# Patient Record
Sex: Female | Born: 1960 | Race: White | Hispanic: No | Marital: Married | Smoking: Never smoker
Health system: Southern US, Community
[De-identification: ages and names within clinical notes are randomized; demographics above are authoritative.]

## PROBLEM LIST (undated history)

## (undated) DIAGNOSIS — K449 Diaphragmatic hernia without obstruction or gangrene: Secondary | ICD-10-CM

## (undated) DIAGNOSIS — J302 Other seasonal allergic rhinitis: Secondary | ICD-10-CM

## (undated) DIAGNOSIS — I10 Essential (primary) hypertension: Secondary | ICD-10-CM

## (undated) DIAGNOSIS — E039 Hypothyroidism, unspecified: Secondary | ICD-10-CM

## (undated) DIAGNOSIS — E079 Disorder of thyroid, unspecified: Secondary | ICD-10-CM

## (undated) DIAGNOSIS — Z9889 Other specified postprocedural states: Secondary | ICD-10-CM

## (undated) DIAGNOSIS — K219 Gastro-esophageal reflux disease without esophagitis: Secondary | ICD-10-CM

## (undated) DIAGNOSIS — F419 Anxiety disorder, unspecified: Secondary | ICD-10-CM

## (undated) DIAGNOSIS — F32A Depression, unspecified: Secondary | ICD-10-CM

## (undated) DIAGNOSIS — R011 Cardiac murmur, unspecified: Secondary | ICD-10-CM

## (undated) HISTORY — DX: Other seasonal allergic rhinitis: J30.2

## (undated) HISTORY — DX: Hypothyroidism, unspecified: E03.9

## (undated) HISTORY — PX: ABDOMINAL HYSTERECTOMY: SHX81

## (undated) HISTORY — DX: Cardiac murmur, unspecified: R01.1

## (undated) HISTORY — PX: NASAL SEPTUM SURGERY: SHX37

## (undated) HISTORY — DX: Depression, unspecified: F32.A

## (undated) HISTORY — DX: Anxiety disorder, unspecified: F41.9

## (undated) HISTORY — DX: Other specified postprocedural states: Z98.890

## (undated) HISTORY — DX: Diaphragmatic hernia without obstruction or gangrene: K44.9

## (undated) HISTORY — DX: Gastro-esophageal reflux disease without esophagitis: K21.9

## (undated) HISTORY — PX: APPENDECTOMY: SHX54

---

## 1988-03-29 HISTORY — PX: DILATION AND CURETTAGE OF UTERUS: SHX78

## 1992-03-29 HISTORY — PX: WISDOM TOOTH EXTRACTION: SHX21

## 2006-06-06 ENCOUNTER — Emergency Department (HOSPITAL_COMMUNITY): Admission: EM | Admit: 2006-06-06 | Discharge: 2006-06-06 | Payer: Self-pay | Admitting: *Deleted
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
## 2019-05-28 DIAGNOSIS — E039 Hypothyroidism, unspecified: Secondary | ICD-10-CM | POA: Diagnosis not present

## 2019-05-28 DIAGNOSIS — F419 Anxiety disorder, unspecified: Secondary | ICD-10-CM | POA: Diagnosis not present

## 2019-05-28 DIAGNOSIS — I10 Essential (primary) hypertension: Secondary | ICD-10-CM | POA: Diagnosis not present

## 2019-05-28 DIAGNOSIS — K219 Gastro-esophageal reflux disease without esophagitis: Secondary | ICD-10-CM | POA: Diagnosis not present

## 2019-06-11 DIAGNOSIS — E785 Hyperlipidemia, unspecified: Secondary | ICD-10-CM | POA: Diagnosis not present

## 2019-06-11 DIAGNOSIS — I10 Essential (primary) hypertension: Secondary | ICD-10-CM | POA: Diagnosis not present

## 2019-06-11 DIAGNOSIS — E039 Hypothyroidism, unspecified: Secondary | ICD-10-CM | POA: Diagnosis not present

## 2019-06-11 DIAGNOSIS — K219 Gastro-esophageal reflux disease without esophagitis: Secondary | ICD-10-CM | POA: Diagnosis not present

## 2019-06-19 DIAGNOSIS — Z1231 Encounter for screening mammogram for malignant neoplasm of breast: Secondary | ICD-10-CM | POA: Diagnosis not present

## 2019-06-19 DIAGNOSIS — Z803 Family history of malignant neoplasm of breast: Secondary | ICD-10-CM | POA: Diagnosis not present

## 2019-07-03 DIAGNOSIS — R922 Inconclusive mammogram: Secondary | ICD-10-CM | POA: Diagnosis not present

## 2019-07-03 DIAGNOSIS — R928 Other abnormal and inconclusive findings on diagnostic imaging of breast: Secondary | ICD-10-CM | POA: Diagnosis not present

## 2019-07-03 DIAGNOSIS — N6002 Solitary cyst of left breast: Secondary | ICD-10-CM | POA: Diagnosis not present

## 2019-10-18 DIAGNOSIS — I10 Essential (primary) hypertension: Secondary | ICD-10-CM | POA: Diagnosis not present

## 2019-10-18 DIAGNOSIS — K219 Gastro-esophageal reflux disease without esophagitis: Secondary | ICD-10-CM | POA: Diagnosis not present

## 2019-10-18 DIAGNOSIS — E785 Hyperlipidemia, unspecified: Secondary | ICD-10-CM | POA: Diagnosis not present

## 2019-10-18 DIAGNOSIS — E039 Hypothyroidism, unspecified: Secondary | ICD-10-CM | POA: Diagnosis not present

## 2019-10-26 ENCOUNTER — Encounter: Payer: Self-pay | Admitting: Emergency Medicine

## 2019-10-26 ENCOUNTER — Emergency Department
Admission: EM | Admit: 2019-10-26 | Discharge: 2019-10-26 | Disposition: A | Discharge status: Home / Self Care | Payer: BC Managed Care – PPO | Source: Home / Self Care

## 2019-10-26 ENCOUNTER — Other Ambulatory Visit: Payer: Self-pay

## 2019-10-26 DIAGNOSIS — J029 Acute pharyngitis, unspecified: Secondary | ICD-10-CM | POA: Diagnosis not present

## 2019-10-26 HISTORY — DX: Disorder of thyroid, unspecified: E07.9

## 2019-10-26 HISTORY — DX: Essential (primary) hypertension: I10

## 2019-10-26 LAB — POCT RAPID STREP A (OFFICE): Rapid Strep A Screen: NEGATIVE

## 2019-10-26 NOTE — Discharge Instructions (Addendum)
Return if any problems.

## 2019-10-26 NOTE — ED Triage Notes (Signed)
Sore throat x 2-3 days R ear pain started last night  Tylenol at 0500 (1gm) Exposure to strep - sister runs a daycare No COVID vaccine Denies any fever

## 2019-10-26 NOTE — ED Provider Notes (Signed)
[facility]    CSN: [id number] Arrival date & time: 10/26/19  1856      History   Chief Complaint Chief Complaint  Patient presents with  . Sore Throat  . Otalgia    right    HPI Kelli Cooke is a 59 y.o. female.   The history is provided by the patient.  Sore Throat This is a new problem. The current episode started more than 2 days ago. The problem occurs constantly. The problem has been gradually worsening. Nothing aggravates the symptoms. Nothing relieves the symptoms. She has tried nothing for the symptoms. The treatment provided moderate relief.  Otalgia  Pt denies possible covid exposure. Pt is concerned that she has strep. Pt reports no covid risk.  Past Medical History:  Diagnosis Date  . Hypertension   . Thyroid disease     There are no problems to display for this patient.   Past Surgical History:  Procedure Laterality Date  . ABDOMINAL HYSTERECTOMY    . APPENDECTOMY      OB History   No obstetric history on file.      Home Medications    Prior to Admission medications   Medication Sig Start Date End Date Taking? Authorizing Provider  busPIRone (BUSPAR) 15 MG tablet Take 15 mg by mouth daily. 08/17/19  Yes [provider]  levothyroxine (SYNTHROID) 88 MCG tablet Take 88 mcg by mouth daily. 10/04/19  Yes [provider]  lisinopril (ZESTRIL) 20 MG tablet Take 20 mg by mouth daily. 10/22/19  Yes [provider]  omeprazole (PRILOSEC) 20 MG capsule Take 20 mg by mouth daily. 08/28/19  Yes [provider]    Family History Family History  Problem Relation Age of Onset  . AAA (abdominal aortic aneurysm) Mother   . Healthy Sister   . Healthy Brother   . Hypertension Sister   . Hypertension Sister   . Thyroid disease Sister     Social History Social History   Tobacco Use  . Smoking status: Never Smoker  . Smokeless tobacco: Never Used  Vaping Use  . Vaping Use: Never used  Substance Use  Topics  . Alcohol use: Yes    Alcohol/week: 1.0 standard drink    Types: 1 Glasses of wine per week  . Drug use: Never     Allergies   Patient has no known allergies.   Review of Systems Review of Systems  HENT: Positive for ear pain.   All other systems reviewed and are negative.    Physical Exam Triage Vital Signs ED Triage Vitals  Enc Vitals Group     BP 10/26/19 0954 (!) 135/90     Pulse Rate 10/26/19 0954 73     Resp 10/26/19 0954 15     Temp 10/26/19 0954 98.7 F (37.1 C)     Temp Source 10/26/19 0954 Oral     SpO2 10/26/19 0954 97 %     Weight 10/26/19 0957 157 lb (71.2 kg)     Height 10/26/19 0957 5' 3.5" (1.613 m)     Head Circumference --      Peak Flow --      Pain Score 10/26/19 0956 4     Pain Loc --      Pain Edu? --      Excl. in [place]? --    No data found.  Updated Vital Signs BP (!) 135/90 (BP Location: Right Arm)   Pulse 73   Temp 98.7 F (  37.1 C) (Oral)   Resp 15   Ht 5' 3.5" (1.613 m)   Wt 71.2 kg   SpO2 97%   BMI 27.38 kg/m   Visual Acuity Right Eye Distance:   Left Eye Distance:   Bilateral Distance:    Right Eye Near:   Left Eye Near:    Bilateral Near:     Physical Exam Vitals and nursing note reviewed.  Constitutional:      Appearance: She is well-developed.  HENT:     Head: Normocephalic.     Mouth/Throat:     Pharynx: Posterior oropharyngeal erythema present.  Eyes:     Conjunctiva/sclera: Conjunctivae normal.  Cardiovascular:     Rate and Rhythm: Normal rate.  Pulmonary:     Effort: Pulmonary effort is normal.  Abdominal:     General: There is no distension.  Musculoskeletal:        General: Normal range of motion.     Cervical back: Normal range of motion.  Skin:    General: Skin is warm.  Neurological:     General: No focal deficit present.     Mental Status: She is alert and oriented to person, place, and time.  Psychiatric:        Mood and Affect: Mood normal.      UC Treatments / Results   Labs (all labs ordered are listed, but only abnormal results are displayed) Labs Reviewed - No data to display  EKG   Radiology No results found.  Procedures Procedures (including critical care time)  Medications Ordered in UC Medications - No data to display  Initial Impression / Assessment and Plan / UC Course  I have reviewed the triage vital signs and the nursing notes.  Pertinent labs & imaging results that were available during my care of the patient were reviewed by me and considered in my medical decision making (see chart for details).     MDM: Strep is negative.  Pt declines covid testing.  Final Clinical Impressions(s) / UC Diagnoses   Final diagnoses:  Acute pharyngitis, unspecified etiology   Discharge Instructions   None    ED Prescriptions    None     PDMP not reviewed this encounter.  An After Visit Summary was printed and given to the patient.    Fransico Meadow, [place] 10/26/19 1027

## 2019-10-29 LAB — STREP A DNA PROBE: Group A Strep Probe: NOT DETECTED

## 2020-03-25 ENCOUNTER — Telehealth: Payer: Self-pay

## 2020-03-25 ENCOUNTER — Other Ambulatory Visit: Payer: Self-pay

## 2020-03-25 ENCOUNTER — Emergency Department
Admission: EM | Admit: 2020-03-25 | Discharge: 2020-03-25 | Disposition: A | Discharge status: Home / Self Care | Payer: BC Managed Care – PPO | Source: Home / Self Care | Attending: Internal Medicine | Admitting: Internal Medicine

## 2020-03-25 DIAGNOSIS — J3089 Other allergic rhinitis: Secondary | ICD-10-CM

## 2020-03-25 MED ORDER — GUAIFENESIN ER 600 MG PO TB12: 600.0000 mg | ORAL_TABLET | Freq: Two times a day (BID) | ORAL | 0 refills | Status: AC

## 2020-03-25 MED ORDER — GUAIFENESIN ER 600 MG PO TB12: 600.0000 mg | ORAL_TABLET | Freq: Two times a day (BID) | ORAL | 0 refills | Status: DC

## 2020-03-25 MED ORDER — BENZONATATE 100 MG PO CAPS: 100.0000 mg | ORAL_CAPSULE | Freq: Three times a day (TID) | ORAL | 0 refills | Status: DC

## 2020-03-25 NOTE — ED Triage Notes (Signed)
Patient presents to Urgent Care with complaints of runny nose and nasal congestion since about a week ago. Patient reports it feels like the congestion has spread to her left ear as well. Has been taking otc cold and flu meds w/ minimal improvement. Pt has not been vaccinated for covid or flu.

## 2020-03-25 NOTE — ED Provider Notes (Signed)
[facility]    CSN: [id number] Arrival date & time: 03/25/20  0931      History   Chief Complaint Chief Complaint  Patient presents with   Nasal Congestion    HPI Kelli Cooke is a 59 y.o. female with a history of seasonal allergies on Claritin and Flonase comes to urgent care with complaints of nasal congestion over the past 3 to 4 days.  Patient says symptoms started insidiously and is been progressively worse.  She currently has pressure and some discomfort in the left ear.  No shortness of breath, wheezing or sputum production.  She has fullness in the left ear and itchy throat as well as eyes.  No sick contacts.  No generalized body aches.  No fever or chills.  Nasal discharge is clear with some yellowish specks.  She has had small amount of nasal bleeding from the right ear. HPI  Past Medical History:  Diagnosis Date   Hypertension    Thyroid disease     There are no problems to display for this patient.   Past Surgical History:  Procedure Laterality Date   ABDOMINAL HYSTERECTOMY     APPENDECTOMY      OB History   No obstetric history on file.      Home Medications    Prior to Admission medications   Medication Sig Start Date End Date Taking? Authorizing Provider  benzonatate (TESSALON) 100 MG capsule Take 1 capsule (100 mg total) by mouth every 8 (eight) hours. 03/25/20  Yes Truong Delcastillo, Myrene Galas, MD  guaiFENesin (MUCINEX) 600 MG 12 hr tablet Take 1 tablet (600 mg total) by mouth 2 (two) times daily for 14 days. 03/25/20 04/08/20 Yes Hilary Milks, Myrene Galas, MD  busPIRone (BUSPAR) 15 MG tablet Take 15 mg by mouth daily. 08/17/19   [provider]  levothyroxine (SYNTHROID) 88 MCG tablet Take 88 mcg by mouth daily. 10/04/19   [provider]  lisinopril (ZESTRIL) 20 MG tablet Take 20 mg by mouth daily. 10/22/19   [provider]  omeprazole (PRILOSEC) 20 MG capsule Take 20 mg by mouth daily. 08/28/19   [provider]     Family History Family History  Problem Relation Age of Onset   AAA (abdominal aortic aneurysm) Mother    Healthy Sister    Healthy Brother    Hypertension Sister    Hypertension Sister    Thyroid disease Sister     Social History Social History   Tobacco Use   Smoking status: Never Smoker   Smokeless tobacco: Never Used  Scientific laboratory technician Use: Never used  Substance Use Topics   Alcohol use: Yes    Alcohol/week: 1.0 standard drink    Types: 1 Glasses of wine per week   Drug use: Never     Allergies   Sulfa antibiotics   Review of Systems Review of Systems  Constitutional: Negative for activity change, chills, fatigue and fever.  HENT: Positive for congestion, ear pain, rhinorrhea and sore throat.   Respiratory: Positive for cough. Negative for shortness of breath and wheezing.   Gastrointestinal: Negative.   Musculoskeletal: Positive for myalgias. Negative for arthralgias.  Neurological: Negative for headaches.     Physical Exam Triage Vital Signs ED Triage Vitals  Enc Vitals Group     BP 03/25/20 0950 (!) 135/93     Pulse Rate 03/25/20 0950 76     Resp 03/25/20 0950 16     Temp 03/25/20 0950 98.3 F (  36.8 C)     Temp Source 03/25/20 0950 Oral     SpO2 03/25/20 0950 99 %     Weight --      Height --      Head Circumference --      Peak Flow --      Pain Score 03/25/20 0949 5     Pain Loc --      Pain Edu? --      Excl. in [place]? --    No data found.  Updated Vital Signs BP (!) 135/93 (BP Location: Right Arm)    Pulse 76    Temp 98.3 F (36.8 C) (Oral)    Resp 16    SpO2 99%   Visual Acuity Right Eye Distance:   Left Eye Distance:   Bilateral Distance:    Right Eye Near:   Left Eye Near:    Bilateral Near:     Physical Exam Vitals and nursing note reviewed.  Constitutional:      General: She is not in acute distress.    Appearance: She is ill-appearing.  HENT:     Mouth/Throat:     Mouth: Mucous membranes are moist.      Pharynx: No oropharyngeal exudate or posterior oropharyngeal erythema.  Cardiovascular:     Rate and Rhythm: Normal rate and regular rhythm.     Pulses: Normal pulses.     Heart sounds: Normal heart sounds.  Pulmonary:     Effort: Pulmonary effort is normal.     Breath sounds: Normal breath sounds.  Neurological:     General: No focal deficit present.     Mental Status: She is alert and oriented to person, place, and time.      UC Treatments / Results  Labs (all labs ordered are listed, but only abnormal results are displayed) Labs Reviewed - No data to display  EKG   Radiology No results found.  Procedures Procedures (including critical care time)  Medications Ordered in UC Medications - No data to display  Initial Impression / Assessment and Plan / UC Course  I have reviewed the triage vital signs and the nursing notes.  Pertinent labs & imaging results that were available during my care of the patient were reviewed by me and considered in my medical decision making (see chart for details).     1.  Allergic rhinosinisitis: Patient is advised to continue Flonase and Zyrtec We will add Mucinex to the treatment regimen Saline nasal spray Humidifier use will be helpful No indication for antibiotics at this time Final Clinical Impressions(s) / UC Diagnoses   Final diagnoses:  Allergic rhinitis due to other allergic trigger, unspecified seasonality     Discharge Instructions     Please use saline nasal spray We will add Mucinex to your treatment regimen No indication for antibiotics at this time If your symptoms worsen please return to the urgent care to be reevaluated.   ED Prescriptions    Medication Sig Dispense Auth. Provider   guaiFENesin (MUCINEX) 600 MG 12 hr tablet Take 1 tablet (600 mg total) by mouth 2 (two) times daily for 14 days. 28 tablet Farouk Vivero, Britta Mccreedy, MD   benzonatate (TESSALON) 100 MG capsule Take 1 capsule (100 mg total) by mouth every  8 (eight) hours. 21 capsule Jlyn Bracamonte, Britta Mccreedy, MD     PDMP not reviewed this encounter.   Merrilee Jansky, MD 03/25/20 1055

## 2020-03-25 NOTE — Telephone Encounter (Signed)
Medications changed to alternate pharmacy location per pt request.

## 2020-03-25 NOTE — Discharge Instructions (Signed)
Please use saline nasal spray We will add Mucinex to your treatment regimen No indication for antibiotics at this time If your symptoms worsen please return to the urgent care to be reevaluated.

## 2020-05-09 DIAGNOSIS — J302 Other seasonal allergic rhinitis: Secondary | ICD-10-CM | POA: Diagnosis not present

## 2020-05-09 DIAGNOSIS — F411 Generalized anxiety disorder: Secondary | ICD-10-CM | POA: Diagnosis not present

## 2020-05-09 DIAGNOSIS — I10 Essential (primary) hypertension: Secondary | ICD-10-CM | POA: Diagnosis not present

## 2020-05-09 DIAGNOSIS — E039 Hypothyroidism, unspecified: Secondary | ICD-10-CM | POA: Diagnosis not present

## 2020-05-13 DIAGNOSIS — J302 Other seasonal allergic rhinitis: Secondary | ICD-10-CM | POA: Diagnosis not present

## 2020-05-19 ENCOUNTER — Emergency Department (INDEPENDENT_AMBULATORY_CARE_PROVIDER_SITE_OTHER): Payer: BC Managed Care – PPO

## 2020-05-19 ENCOUNTER — Emergency Department
Admission: EM | Admit: 2020-05-19 | Discharge: 2020-05-19 | Disposition: A | Discharge status: Home / Self Care | Payer: BC Managed Care – PPO | Source: Home / Self Care | Attending: Family Medicine | Admitting: Family Medicine

## 2020-05-19 ENCOUNTER — Other Ambulatory Visit: Payer: Self-pay

## 2020-05-19 DIAGNOSIS — M25572 Pain in left ankle and joints of left foot: Secondary | ICD-10-CM | POA: Diagnosis not present

## 2020-05-19 DIAGNOSIS — W109XXA Fall (on) (from) unspecified stairs and steps, initial encounter: Secondary | ICD-10-CM | POA: Diagnosis not present

## 2020-05-19 DIAGNOSIS — S93492A Sprain of other ligament of left ankle, initial encounter: Secondary | ICD-10-CM

## 2020-05-19 DIAGNOSIS — S99912A Unspecified injury of left ankle, initial encounter: Secondary | ICD-10-CM | POA: Diagnosis not present

## 2020-05-19 NOTE — ED Triage Notes (Signed)
Pt presents to Urgent Care with c/o L ankle pain following injury today. She reports tripping while going down the stairs and believes she twisted her L ankle. She is unable to bear weight. Lateral L ankle is noted to be swollen and bruised. Pt iced it and took ibuprofen before coming to UC.

## 2020-05-19 NOTE — Discharge Instructions (Addendum)
Ice and elevate to reduce the swelling and pain Continue ibuprofen 800 up to 3 times a day See sports medicine or orthopedics if you fail to improve

## 2020-05-19 NOTE — ED Provider Notes (Signed)
[name]    CSN: [id number] Arrival date & time: 05/19/20  1300      History   Chief Complaint Chief Complaint  Patient presents with  . Ankle Pain    Injury to L ankle    HPI Kelli Cooke is a 60 y.o. female.   HPI   Patient tripped going downstairs today and injured her left ankle.  She states that her foot inverted, and she fell.  She felt a pop.  She had to sit for a few minutes before she was able to stand because of the pain.  She is here for evaluation.  Has not had prior problems or fractures with this ankle.  She is in good health with well-managed hypothyroidism and hypertension. She put some ice on the area.  She took some ibuprofen.  These things have helped.  Past Medical History:  Diagnosis Date  . Hypertension   . Thyroid disease     There are no problems to display for this patient.   Past Surgical History:  Procedure Laterality Date  . ABDOMINAL HYSTERECTOMY    . APPENDECTOMY      OB History   No obstetric history on file.      Home Medications    Prior to Admission medications   Medication Sig Start Date End Date Taking? Authorizing Provider  azithromycin (ZITHROMAX) 500 MG tablet Take 500 mg by mouth daily. 05/16/20 05/20/20 Yes [provider]  busPIRone (BUSPAR) 15 MG tablet Take 15 mg by mouth daily. 08/17/19   [provider]  levothyroxine (SYNTHROID) 88 MCG tablet Take 88 mcg by mouth daily. 10/04/19   [provider]  lisinopril (ZESTRIL) 20 MG tablet Take 20 mg by mouth daily. 10/22/19   [provider]  omeprazole (PRILOSEC) 20 MG capsule Take 20 mg by mouth daily. 08/28/19   [provider]    Family History Family History  Problem Relation Age of Onset  . AAA (abdominal aortic aneurysm) Mother   . Diabetes Mother   . Heart disease Father   . Healthy Sister   . Healthy Brother   . Hypertension Sister   . Hypertension Sister   . Thyroid disease Sister     Social  History Social History   Tobacco Use  . Smoking status: Never Smoker  . Smokeless tobacco: Never Used  Vaping Use  . Vaping Use: Never used  Substance Use Topics  . Alcohol use: Not Currently    Alcohol/week: 1.0 standard drink    Types: 1 Glasses of wine per week  . Drug use: Never     Allergies   Sulfa antibiotics   Review of Systems Review of Systems See HPI  Physical Exam Triage Vital Signs ED Triage Vitals  Enc Vitals Group     BP 05/19/20 1335 (!) 145/86     Pulse Rate 05/19/20 1335 83     Resp 05/19/20 1335 20     Temp 05/19/20 1335 99 F (37.2 C)     Temp Source 05/19/20 1335 Oral     SpO2 05/19/20 1335 97 %     Weight 05/19/20 1328 150 lb (68 kg)     Height 05/19/20 1328 5' 3.5" (1.613 m)     Head Circumference --      Peak Flow --      Pain Score 05/19/20 1328 6     Pain Loc --      Pain Edu? --  Excl. in [place]? --    No data found.  Updated Vital Signs BP (!) 145/86 (BP Location: Left Arm)   Pulse 83   Temp 99 F (37.2 C) (Oral)   Resp 20   Ht 5' 3.5" (1.613 m)   Wt 68 kg   SpO2 97%   BMI 26.15 kg/m      Physical Exam Constitutional:      General: She is not in acute distress.    Appearance: She is well-developed and well-nourished.     Comments: Mask is in place  HENT:     Head: Normocephalic and atraumatic.     Mouth/Throat:     Mouth: Oropharynx is clear and moist.  Eyes:     Conjunctiva/sclera: Conjunctivae normal.     Pupils: Pupils are equal, round, and reactive to light.  Cardiovascular:     Rate and Rhythm: Normal rate.  Pulmonary:     Effort: Pulmonary effort is normal. No respiratory distress.  Abdominal:     General: There is no distension.     Palpations: Abdomen is soft.  Musculoskeletal:        General: No edema. Normal range of motion.     Cervical back: Normal range of motion.     Comments: Left ankle has tenderness over the distal fibula tip and swelling over the lateral ankle.  There is tenderness in the  lateral foot, approximately the at the ATFL insertion.  Good range of motion.  No instability  Skin:    General: Skin is warm and dry.  Neurological:     Mental Status: She is alert.     Gait: Gait abnormal.  Psychiatric:        Behavior: Behavior normal.      UC Treatments / Results  Labs (all labs ordered are listed, but only abnormal results are displayed) Labs Reviewed - No data to display  EKG   Radiology DG Ankle Complete Left  Result Date: 05/19/2020 CLINICAL DATA:  Ankle injury today EXAM: LEFT ANKLE COMPLETE - 3+ VIEW COMPARISON:  None. FINDINGS: There is no evidence of fracture, dislocation, or joint effusion. There is no evidence of arthropathy or other focal bone abnormality. Soft tissues are unremarkable. IMPRESSION: Negative. Electronically Signed   By: Franchot Gallo M.D.   On: 05/19/2020 14:00    Procedures Procedures (including critical care time)  Medications Ordered in UC Medications - No data to display  Initial Impression / Assessment and Plan / UC Course  I have reviewed the triage vital signs and the nursing notes.  Pertinent labs & imaging results that were available during my care of the patient were reviewed by me and considered in my medical decision making (see chart for details).     Reviewed healing for ankle sprain.  See sports medicine if fails to improve Final Clinical Impressions(s) / UC Diagnoses   Final diagnoses:  Sprain of anterior talofibular ligament of left ankle, initial encounter     Discharge Instructions     Ice and elevate to reduce the swelling and pain Continue ibuprofen 800 up to 3 times a day See sports medicine or orthopedics if you fail to improve    ED Prescriptions    None     PDMP not reviewed this encounter.   Raylene Everts, MD 05/19/20 [phone]

## 2020-05-23 ENCOUNTER — Emergency Department
Admission: EM | Admit: 2020-05-23 | Discharge: 2020-05-23 | Disposition: A | Discharge status: Home / Self Care | Payer: BC Managed Care – PPO | Source: Home / Self Care | Attending: Internal Medicine | Admitting: Internal Medicine

## 2020-05-23 ENCOUNTER — Encounter: Payer: Self-pay | Admitting: Emergency Medicine

## 2020-05-23 ENCOUNTER — Telehealth: Payer: Self-pay | Admitting: Emergency Medicine

## 2020-05-23 ENCOUNTER — Other Ambulatory Visit: Payer: Self-pay

## 2020-05-23 ENCOUNTER — Emergency Department (INDEPENDENT_AMBULATORY_CARE_PROVIDER_SITE_OTHER): Payer: BC Managed Care – PPO

## 2020-05-23 DIAGNOSIS — R059 Cough, unspecified: Secondary | ICD-10-CM | POA: Diagnosis not present

## 2020-05-23 DIAGNOSIS — R058 Other specified cough: Secondary | ICD-10-CM

## 2020-05-23 DIAGNOSIS — J209 Acute bronchitis, unspecified: Secondary | ICD-10-CM | POA: Diagnosis not present

## 2020-05-23 MED ORDER — PREDNISONE 20 MG PO TABS: 20.0000 mg | ORAL_TABLET | Freq: Every day | ORAL | 0 refills | Status: AC

## 2020-05-23 MED ORDER — HYDROCOD POLST-CPM POLST ER 10-8 MG/5ML PO SUER: 5.0000 mL | Freq: Two times a day (BID) | ORAL | 0 refills | Status: DC | PRN

## 2020-05-23 MED ORDER — HYDROCODONE-HOMATROPINE 5-1.5 MG/5ML PO SYRP: 5.0000 mL | ORAL_SOLUTION | Freq: Four times a day (QID) | ORAL | 0 refills | Status: DC | PRN

## 2020-05-23 NOTE — Telephone Encounter (Signed)
Call back from [place] who confirmed pharmacy did have tussionex available

## 2020-05-23 NOTE — ED Triage Notes (Signed)
Pt is coughing nonstop which causes her to vomit and have episodes of incontinence  Recently switched allergy meds and nasal spray Cough is barky sounding No COVID vaccine or COVID  Scooter for left ankle  Denies fever  Finished z-pak last week

## 2020-05-23 NOTE — Discharge Instructions (Signed)
Please take medications as prescribed Oral fluid intake Tylenol as needed for pain If symptoms worsen please return to the urgent care. If you have problems with filling your prescription medications please go to the urgent care.

## 2020-05-23 NOTE — Telephone Encounter (Signed)
Cough medicine is not available at pharmacy (on national back order per pharmacy) pt's husband is picking up prednisone  & will see if tussionex is available - pt will call back - Dr Lanny Cramp updated by RN

## 2020-05-23 NOTE — ED Provider Notes (Signed)
[facility]    CSN: [id number] Arrival date & time: 05/23/20  1038      History   Chief Complaint Chief Complaint  Patient presents with  . Cough    HPI Kelli Cooke is a 60 y.o. female comes to the urgent care with complaints of persistent cough over the past few weeks.  Cough is productive of clear sputum.  Cough has been worsening over the past week.  At the outset, 3 weeks ago, patient was seen and evaluated.  She denies any fever or chills.  No shortness of breath, chest pain or chest pressure.  No wheezing.  Patient has completed a round of steroids 2 weeks ago and azithromycin.  Cough seems to have gotten better and has worsened over the past week.  Patient denies any dizziness.  She has a history of seasonal allergies on over-the-counter remedies.Marland Kitchen   HPI  Past Medical History:  Diagnosis Date  . Hypertension   . Thyroid disease     There are no problems to display for this patient.   Past Surgical History:  Procedure Laterality Date  . ABDOMINAL HYSTERECTOMY    . APPENDECTOMY      OB History   No obstetric history on file.      Home Medications    Prior to Admission medications   Medication Sig Start Date End Date Taking? Authorizing Provider  busPIRone (BUSPAR) 15 MG tablet Take 15 mg by mouth daily. 08/17/19  Yes [provider]  chlorpheniramine-HYDROcodone (TUSSIONEX PENNKINETIC ER) 10-8 MG/5ML SUER Take 5 mLs by mouth every 12 (twelve) hours as needed for cough. 05/23/20  Yes Marzelle Rutten, Myrene Galas, MD  levothyroxine (SYNTHROID) 88 MCG tablet Take 88 mcg by mouth daily. 10/04/19  Yes [provider]  lisinopril (ZESTRIL) 20 MG tablet Take 20 mg by mouth daily. 10/22/19  Yes [provider]  omeprazole (PRILOSEC) 20 MG capsule Take 20 mg by mouth daily. 08/28/19  Yes [provider]  predniSONE (DELTASONE) 20 MG tablet Take 1 tablet (20 mg total) by mouth daily for 5 days. 05/23/20 05/28/20 Yes Kaynen Minner, Myrene Galas,  MD    Family History Family History  Problem Relation Age of Onset  . AAA (abdominal aortic aneurysm) Mother   . Diabetes Mother   . Heart disease Father   . Healthy Sister   . Healthy Brother   . Hypertension Sister   . Hypertension Sister   . Thyroid disease Sister     Social History Social History   Tobacco Use  . Smoking status: Never Smoker  . Smokeless tobacco: Never Used  Vaping Use  . Vaping Use: Never used  Substance Use Topics  . Alcohol use: Not Currently    Alcohol/week: 1.0 standard drink    Types: 1 Glasses of wine per week  . Drug use: Never     Allergies   Sulfa antibiotics   Review of Systems Review of Systems  Constitutional: Negative.   HENT: Negative.   Respiratory: Positive for cough. Negative for choking, chest tightness, shortness of breath and wheezing.   Cardiovascular: Negative.   Neurological: Negative.      Physical Exam Triage Vital Signs ED Triage Vitals  Enc Vitals Group     BP 05/23/20 1058 117/70     Pulse Rate 05/23/20 1058 87     Resp 05/23/20 1058 17     Temp 05/23/20 1058 98.6 F (37 C)     Temp Source 05/23/20 1058 Oral  SpO2 05/23/20 1058 95 %     Weight --      Height --      Head Circumference --      Peak Flow --      Pain Score 05/23/20 1101 3     Pain Loc --      Pain Edu? --      Excl. in [place]? --    No data found.  Updated Vital Signs BP 117/70 (BP Location: Right Arm)   Pulse 87   Temp 98.6 F (37 C) (Oral)   Resp 17   SpO2 95%   Visual Acuity Right Eye Distance:   Left Eye Distance:   Bilateral Distance:    Right Eye Near:   Left Eye Near:    Bilateral Near:     Physical Exam Vitals and nursing note reviewed.  Constitutional:      General: She is not in acute distress.    Appearance: She is not ill-appearing or toxic-appearing.  HENT:     Right Ear: Tympanic membrane normal.     Left Ear: Tympanic membrane normal.  Cardiovascular:     Rate and Rhythm: Normal rate and regular  rhythm.     Pulses: Normal pulses.     Heart sounds: Normal heart sounds.  Pulmonary:     Effort: Pulmonary effort is normal.     Breath sounds: Normal breath sounds. No wheezing, rhonchi or rales.  Abdominal:     General: Bowel sounds are normal.     Palpations: Abdomen is soft.  Neurological:     Mental Status: She is alert.      UC Treatments / Results  Labs (all labs ordered are listed, but only abnormal results are displayed) Labs Reviewed - No data to display  EKG   Radiology DG Chest 2 View  Result Date: 05/23/2020 CLINICAL DATA:  Persistent cough. EXAM: CHEST - 2 VIEW COMPARISON:  None. FINDINGS: The heart size and mediastinal contours are within normal limits. Both lungs are clear. The visualized skeletal structures are unremarkable. IMPRESSION: No active cardiopulmonary disease. Electronically Signed   By: Nolon Nations M.D.   On: 05/23/2020 11:54    Procedures Procedures (including critical care time)  Medications Ordered in UC Medications - No data to display  Initial Impression / Assessment and Plan / UC Course  I have reviewed the triage vital signs and the nursing notes.  Pertinent labs & imaging results that were available during my care of the patient were reviewed by me and considered in my medical decision making (see chart for details).     1.  Acute bronchitis: Chest x-ray is negative for acute lung infiltrate Prednisone 20 mg orally daily for 5 days Tussionex twice daily as needed for cough If symptoms worsen patient is advised to return to urgent care to be reevaluated  Final Clinical Impressions(s) / UC Diagnoses   Final diagnoses:  Acute bronchitis, unspecified organism     Discharge Instructions     Please take medications as prescribed Oral fluid intake Tylenol as needed for pain If symptoms worsen please return to the urgent care. If you have problems with filling your prescription medications please go to the urgent  care.   ED Prescriptions    Medication Sig Dispense Auth. Provider   predniSONE (DELTASONE) 20 MG tablet Take 1 tablet (20 mg total) by mouth daily for 5 days. 5 tablet Aryahi Denzler, Myrene Galas, MD   HYDROcodone-homatropine [facility]) 5-1.5 MG/5ML syrup  (Status: Discontinued)  Take 5 mLs by mouth every 6 (six) hours as needed for cough. 120 mL Twana Wileman, Myrene Galas, MD   chlorpheniramine-HYDROcodone [facility] PENNKINETIC ER) 10-8 MG/5ML SUER Take 5 mLs by mouth every 12 (twelve) hours as needed for cough. 140 mL Secily Walthour, Myrene Galas, MD     PDMP not reviewed this encounter.   Chase Picket, MD 05/23/20 [phone]

## 2020-08-04 ENCOUNTER — Ambulatory Visit: Payer: Self-pay | Admitting: Allergy and Immunology

## 2020-08-07 ENCOUNTER — Other Ambulatory Visit: Payer: Self-pay

## 2020-08-07 ENCOUNTER — Emergency Department
Admission: EM | Admit: 2020-08-07 | Discharge: 2020-08-07 | Disposition: A | Discharge status: Home / Self Care | Payer: BC Managed Care – PPO | Source: Home / Self Care

## 2020-08-07 DIAGNOSIS — N3090 Cystitis, unspecified without hematuria: Secondary | ICD-10-CM

## 2020-08-07 LAB — POCT URINALYSIS DIP (MANUAL ENTRY)
Bilirubin, UA: NEGATIVE
Glucose, UA: NEGATIVE mg/dL
Ketones, POC UA: NEGATIVE mg/dL
Nitrite, UA: NEGATIVE
Protein Ur, POC: NEGATIVE mg/dL
Spec Grav, UA: 1.015 (ref 1.010–1.025)
Urobilinogen, UA: 0.2 E.U./dL
pH, UA: 6 (ref 5.0–8.0)

## 2020-08-07 MED ORDER — NITROFURANTOIN MONOHYD MACRO 100 MG PO CAPS: 100.0000 mg | ORAL_CAPSULE | Freq: Two times a day (BID) | ORAL | 0 refills | Status: DC

## 2020-08-07 NOTE — ED Provider Notes (Signed)
[name]    CSN: [id number] Arrival date & time: 08/07/20  6440      History   Chief Complaint Chief Complaint  Patient presents with  . Dysuria    HPI Kelli Cooke is a 60 y.o. female.   HPI   Patient woke up with suprapubic pressure and dysuria this morning.  She thinks she has a bladder infection.  No flank pain.  No fever or chills.  No nausea or vomiting.  No history of kidney stones or kidney problems.  The patient is 60 years old.  She is postmenopausal.  Past Medical History:  Diagnosis Date  . Hypertension   . Thyroid disease     There are no problems to display for this patient.   Past Surgical History:  Procedure Laterality Date  . ABDOMINAL HYSTERECTOMY    . APPENDECTOMY      OB History   No obstetric history on file.      Home Medications    Prior to Admission medications   Medication Sig Start Date End Date Taking? Authorizing Provider  nitrofurantoin, macrocrystal-monohydrate, (MACROBID) 100 MG capsule Take 1 capsule (100 mg total) by mouth 2 (two) times daily. 08/07/20  Yes Raylene Everts, MD  busPIRone (BUSPAR) 15 MG tablet Take 15 mg by mouth daily. 08/17/19   [provider]  chlorpheniramine-HYDROcodone (TUSSIONEX PENNKINETIC ER) 10-8 MG/5ML SUER Take 5 mLs by mouth every 12 (twelve) hours as needed for cough. 05/23/20   Chase Picket, MD  levothyroxine (SYNTHROID) 88 MCG tablet Take 88 mcg by mouth daily. 10/04/19   [provider]  lisinopril (ZESTRIL) 20 MG tablet Take 20 mg by mouth daily. 10/22/19   [provider]  omeprazole (PRILOSEC) 20 MG capsule Take 20 mg by mouth daily. 08/28/19   [provider]    Family History Family History  Problem Relation Age of Onset  . AAA (abdominal aortic aneurysm) Mother   . Diabetes Mother   . Heart disease Father   . Healthy Sister   . Healthy Brother   . Hypertension Sister   . Hypertension Sister   . Thyroid disease Sister      Social History Social History   Tobacco Use  . Smoking status: Never Smoker  . Smokeless tobacco: Never Used  Vaping Use  . Vaping Use: Never used  Substance Use Topics  . Alcohol use: Not Currently    Alcohol/week: 1.0 standard drink    Types: 1 Glasses of wine per week  . Drug use: Never     Allergies   Sulfa antibiotics   Review of Systems Review of Systems See HPI  Physical Exam Triage Vital Signs ED Triage Vitals  Enc Vitals Group     BP 08/07/20 0944 (!) 153/93     Pulse Rate 08/07/20 0944 72     Resp 08/07/20 0944 18     Temp 08/07/20 0944 98.1 F (36.7 C)     Temp Source 08/07/20 0944 Oral     SpO2 08/07/20 0944 98 %     Weight --      Height --      Head Circumference --      Peak Flow --      Pain Score 08/07/20 0945 0     Pain Loc --      Pain Edu? --      Excl. in [place]? --    No data found.  Updated Vital Signs BP (!) 153/93 (  BP Location: Right Arm)   Pulse 72   Temp 98.1 F (36.7 C) (Oral)   Resp 18   SpO2 98%      Physical Exam Constitutional:      General: She is not in acute distress.    Appearance: She is well-developed.     Comments: Mask in place  HENT:     Head: Normocephalic and atraumatic.  Eyes:     Conjunctiva/sclera: Conjunctivae normal.     Pupils: Pupils are equal, round, and reactive to light.  Cardiovascular:     Rate and Rhythm: Normal rate.  Pulmonary:     Effort: Pulmonary effort is normal. No respiratory distress.  Abdominal:     General: There is no distension.     Palpations: Abdomen is soft.     Tenderness: There is no right CVA tenderness or left CVA tenderness.  Musculoskeletal:        General: Normal range of motion.     Cervical back: Normal range of motion.  Skin:    General: Skin is warm and dry.  Neurological:     Mental Status: She is alert.  Psychiatric:        Behavior: Behavior normal.      UC Treatments / Results  Labs (all labs ordered are listed, but only abnormal results are  displayed) Labs Reviewed  POCT URINALYSIS DIP (MANUAL ENTRY) - Abnormal; Notable for the following components:      Result Value   Clarity, UA cloudy (*)    Blood, UA moderate (*)    Leukocytes, UA Moderate (2+) (*)    All other components within normal limits  URINE CULTURE    EKG   Radiology No results found.  Procedures Procedures (including critical care time)  Medications Ordered in UC Medications - No data to display  Initial Impression / Assessment and Plan / UC Course  I have reviewed the triage vital signs and the nursing notes.  Pertinent labs & imaging results that were available during my care of the patient were reviewed by me and considered in my medical decision making (see chart for details).     Culture is pending Final Clinical Impressions(s) / UC Diagnoses   Final diagnoses:  Cystitis     Discharge Instructions     Take the antibiotic 2 times a day until gone Make sure you are drinking plenty of water Call or return if not better in 2 to 3 days   ED Prescriptions    Medication Sig Dispense Auth. Provider   nitrofurantoin, macrocrystal-monohydrate, (MACROBID) 100 MG capsule Take 1 capsule (100 mg total) by mouth 2 (two) times daily. 10 capsule Raylene Everts, MD     PDMP not reviewed this encounter.   Raylene Everts, MD 08/07/20 1028

## 2020-08-07 NOTE — Discharge Instructions (Signed)
Take the antibiotic 2 times a day until gone Make sure you are drinking plenty of water Call or return if not better in 2 to 3 days

## 2020-08-07 NOTE — ED Triage Notes (Signed)
Pt woke up this morning with some dysuria and "pressure". Denies any abd pain. No OTC meds taken.

## 2020-08-08 DIAGNOSIS — E039 Hypothyroidism, unspecified: Secondary | ICD-10-CM | POA: Diagnosis not present

## 2020-08-08 DIAGNOSIS — I10 Essential (primary) hypertension: Secondary | ICD-10-CM | POA: Diagnosis not present

## 2020-08-08 LAB — URINE CULTURE
MICRO NUMBER:: 11882386
SPECIMEN QUALITY:: ADEQUATE

## 2020-09-11 DIAGNOSIS — D485 Neoplasm of uncertain behavior of skin: Secondary | ICD-10-CM | POA: Diagnosis not present

## 2020-09-11 DIAGNOSIS — L821 Other seborrheic keratosis: Secondary | ICD-10-CM | POA: Diagnosis not present

## 2020-11-06 DIAGNOSIS — E039 Hypothyroidism, unspecified: Secondary | ICD-10-CM | POA: Diagnosis not present

## 2020-11-06 DIAGNOSIS — M7989 Other specified soft tissue disorders: Secondary | ICD-10-CM | POA: Diagnosis not present

## 2020-11-06 DIAGNOSIS — R252 Cramp and spasm: Secondary | ICD-10-CM | POA: Diagnosis not present

## 2020-12-02 ENCOUNTER — Emergency Department
Admission: EM | Admit: 2020-12-02 | Discharge: 2020-12-02 | Disposition: A | Discharge status: Home / Self Care | Payer: BC Managed Care – PPO | Source: Home / Self Care

## 2020-12-02 ENCOUNTER — Other Ambulatory Visit: Payer: Self-pay

## 2020-12-02 DIAGNOSIS — J0100 Acute maxillary sinusitis, unspecified: Secondary | ICD-10-CM | POA: Diagnosis not present

## 2020-12-02 DIAGNOSIS — H9201 Otalgia, right ear: Secondary | ICD-10-CM

## 2020-12-02 MED ORDER — CEFDINIR 300 MG PO CAPS: 300.0000 mg | ORAL_CAPSULE | Freq: Two times a day (BID) | ORAL | 0 refills | Status: AC

## 2020-12-02 MED ORDER — FEXOFENADINE HCL 180 MG PO TABS: 180.0000 mg | ORAL_TABLET | Freq: Every day | ORAL | 0 refills | Status: DC

## 2020-12-02 MED ORDER — METHYLPREDNISOLONE ACETATE 80 MG/ML IJ SUSP
80.0000 mg | Freq: Once | INTRAMUSCULAR | Status: AC
  Administered 2020-12-02: 80 mg via INTRAMUSCULAR

## 2020-12-02 NOTE — Discharge Instructions (Addendum)
Advised/instructed patient to take medication as directed with food to completion.  Advised patient to take Allegra daily with first dose of antibiotic for the next 7 days, then as needed.  Encouraged patient to increase daily water intake while taking these medications.

## 2020-12-02 NOTE — ED Triage Notes (Signed)
Pt c/o RT ear pain x 2 weeks. Noticed some discharge a week ago. Pain 6/10 Ibuprofen and zyrtec daily prn.

## 2020-12-02 NOTE — ED Provider Notes (Signed)
[facility]    CSN: [id number] Arrival date & time: 12/02/20  1337      History   Chief Complaint Chief Complaint  Patient presents with   Otalgia    RT    HPI Kelli Cooke is a 60 y.o. female.   HPI 60 year old female presents with right ear pain x 2 weeks.  Patient currently rates ear pain as 6 of 10.  Past Medical History:  Diagnosis Date   Hypertension    Thyroid disease     There are no problems to display for this patient.   Past Surgical History:  Procedure Laterality Date   ABDOMINAL HYSTERECTOMY     APPENDECTOMY      OB History   No obstetric history on file.      Home Medications    Prior to Admission medications   Medication Sig Start Date End Date Taking? Authorizing Provider  cefdinir (OMNICEF) 300 MG capsule Take 1 capsule (300 mg total) by mouth 2 (two) times daily for 7 days. 12/02/20 12/09/20 Yes Eliezer Lofts, FNP  fexofenadine [facility] ALLERGY) 180 MG tablet Take 1 tablet (180 mg total) by mouth daily for 15 days. 12/02/20 12/17/20 Yes Eliezer Lofts, FNP  busPIRone (BUSPAR) 15 MG tablet Take 15 mg by mouth daily. 08/17/19   [provider]  chlorpheniramine-HYDROcodone (TUSSIONEX PENNKINETIC ER) 10-8 MG/5ML SUER Take 5 mLs by mouth every 12 (twelve) hours as needed for cough. 05/23/20   Chase Picket, MD  levothyroxine (SYNTHROID) 88 MCG tablet Take 88 mcg by mouth daily. 10/04/19   [provider]  lisinopril (ZESTRIL) 20 MG tablet Take 20 mg by mouth daily. 10/22/19   [provider]  nitrofurantoin, macrocrystal-monohydrate, (MACROBID) 100 MG capsule Take 1 capsule (100 mg total) by mouth 2 (two) times daily. 08/07/20   Raylene Everts, MD  omeprazole (PRILOSEC) 20 MG capsule Take 20 mg by mouth daily. 08/28/19   [provider]    Family History Family History  Problem Relation Age of Onset   AAA (abdominal aortic aneurysm) Mother    Diabetes Mother    Heart disease Father    Healthy  Sister    Healthy Brother    Hypertension Sister    Hypertension Sister    Thyroid disease Sister     Social History Social History   Tobacco Use   Smoking status: Never   Smokeless tobacco: Never  Vaping Use   Vaping Use: Never used  Substance Use Topics   Alcohol use: Not Currently    Alcohol/week: 1.0 standard drink    Types: 1 Glasses of wine per week   Drug use: Never     Allergies   Sulfa antibiotics   Review of Systems Review of Systems  HENT:  Positive for ear pain.     Physical Exam Triage Vital Signs ED Triage Vitals  Enc Vitals Group     BP 12/02/20 1347 133/79     Pulse Rate 12/02/20 1347 84     Resp 12/02/20 1347 18     Temp 12/02/20 1347 98.4 F (36.9 C)     Temp Source 12/02/20 1347 Oral     SpO2 12/02/20 1347 97 %     Weight --      Height --      Head Circumference --      Peak Flow --      Pain Score 12/02/20 1349 6     Pain Loc --  Pain Edu? --      Excl. in [place]? --    No data found.  Updated Vital Signs BP 133/79 (BP Location: Right Arm)   Pulse 84   Temp 98.4 F (36.9 C) (Oral)   Resp 18   SpO2 97%    Physical Exam Vitals and nursing note reviewed.  Constitutional:      General: She is not in acute distress.    Appearance: Normal appearance. She is normal weight. She is not ill-appearing.  HENT:     Head: Normocephalic and atraumatic.     Right Ear: Hearing and external ear normal. Tympanic membrane is retracted.     Left Ear: Hearing and external ear normal. Tympanic membrane is retracted.     Ears:     Comments: Eustachian tube dysfunction noted bilaterally    Nose:     Right Turbinates: Enlarged.     Left Turbinates: Enlarged.     Right Sinus: Maxillary sinus tenderness present.     Left Sinus: Maxillary sinus tenderness present.     Mouth/Throat:     Lips: Pink.     Mouth: Mucous membranes are moist.     Pharynx: Oropharynx is clear. Uvula midline. No pharyngeal swelling, oropharyngeal exudate, posterior  oropharyngeal erythema or uvula swelling.     Comments: Moderate amount of clear drainage of posterior oropharynx noted Eyes:     Extraocular Movements: Extraocular movements intact.     Conjunctiva/sclera: Conjunctivae normal.     Pupils: Pupils are equal, round, and reactive to light.  Cardiovascular:     Rate and Rhythm: Normal rate and regular rhythm.     Pulses: Normal pulses.     Heart sounds: Normal heart sounds. No murmur heard. Pulmonary:     Effort: Pulmonary effort is normal.     Breath sounds: Normal breath sounds.     Comments: No adventitious breath sounds noted Musculoskeletal:        General: Normal range of motion.     Cervical back: Normal range of motion and neck supple. No tenderness.  Lymphadenopathy:     Cervical: No cervical adenopathy.  Skin:    General: Skin is warm and dry.  Neurological:     General: No focal deficit present.     Mental Status: She is alert and oriented to person, place, and time. Mental status is at baseline.  Psychiatric:        Mood and Affect: Mood normal.        Behavior: Behavior normal.        Thought Content: Thought content normal.     UC Treatments / Results  Labs (all labs ordered are listed, but only abnormal results are displayed) Labs Reviewed - No data to display  EKG   Radiology No results found.  Procedures Procedures (including critical care time)  Medications Ordered in UC Medications  methylPREDNISolone acetate (DEPO-MEDROL) injection 80 mg (80 mg Intramuscular Given 12/02/20 1453)    Initial Impression / Assessment and Plan / UC Course  I have reviewed the triage vital signs and the nursing notes.  Pertinent labs & imaging results that were available during my care of the patient were reviewed by me and considered in my medical decision making (see chart for details).    MDM: 1.  Otalgia of right ear-patient tube dysfunction noted bilaterally; 2.  Subacute maxillary sinusitis-Rx'd cefdinir; 3.   Allergic rhinitis-Rx'd Allegra; Advised/instructed patient to take medication as directed with food to completion.  Advised patient  to take Allegra daily with first dose of antibiotic for the next 7 days, then as needed.  Encouraged patient to increase daily water intake while taking these medications.  Discharged home, hemodynamically stable. Final Clinical Impressions(s) / UC Diagnoses   Final diagnoses:  Otalgia of right ear  Subacute maxillary sinusitis     Discharge Instructions      Advised/instructed patient to take medication as directed with food to completion.  Advised patient to take Allegra daily with first dose of antibiotic for the next 7 days, then as needed.  Encouraged patient to increase daily water intake while taking these medications.     ED Prescriptions     Medication Sig Dispense Auth. Provider   cefdinir (OMNICEF) 300 MG capsule Take 1 capsule (300 mg total) by mouth 2 (two) times daily for 7 days. 14 capsule Eliezer Lofts, FNP   fexofenadine [facility] ALLERGY) 180 MG tablet Take 1 tablet (180 mg total) by mouth daily for 15 days. 15 tablet Eliezer Lofts, FNP      PDMP not reviewed this encounter.   Eliezer Lofts, [place] 12/02/20 1519

## 2021-01-03 ENCOUNTER — Other Ambulatory Visit: Payer: Self-pay

## 2021-01-03 ENCOUNTER — Emergency Department
Admission: EM | Admit: 2021-01-03 | Discharge: 2021-01-03 | Disposition: A | Discharge status: Home / Self Care | Payer: BC Managed Care – PPO | Source: Home / Self Care

## 2021-01-03 DIAGNOSIS — J029 Acute pharyngitis, unspecified: Secondary | ICD-10-CM | POA: Diagnosis not present

## 2021-01-03 MED ORDER — PREDNISONE 20 MG PO TABS: ORAL_TABLET | ORAL | 0 refills | Status: DC

## 2021-01-03 NOTE — ED Triage Notes (Signed)
Pt c/o sore throat x 3 days. Post nasal drip. Also c/o bilateral ear pain. Allegra 180mg  daily. Tylenol Cold and Flu and Tussin DM for cough prn. Denies fever.

## 2021-01-03 NOTE — Discharge Instructions (Addendum)
Advised patient to take medication as directed with food to completion.  Encouraged patient to increase daily water intake while taking this medication. 

## 2021-01-03 NOTE — ED Provider Notes (Signed)
[facility]    CSN: [id number] Arrival date & time: 01/03/21  1340      History   Chief Complaint Chief Complaint  Patient presents with   Sore Throat   Otalgia    Bilateral    HPI Kelli Cooke is a 60 y.o. female.   HPI 60 year old female presents with sore throat bilateral otalgia for 3 days.  Reports trying Tylenol Cold flu and Tussionex DM last night.  Denies fever.  Patient currently taking Allegra previously prescribed by me.  Past Medical History:  Diagnosis Date   Hypertension    Thyroid disease     There are no problems to display for this patient.   Past Surgical History:  Procedure Laterality Date   ABDOMINAL HYSTERECTOMY     APPENDECTOMY      OB History   No obstetric history on file.      Home Medications    Prior to Admission medications   Medication Sig Start Date End Date Taking? Authorizing Provider  predniSONE (DELTASONE) 20 MG tablet Take 3 tabs PO x 5 days. 01/03/21  Yes Eliezer Lofts, FNP  busPIRone (BUSPAR) 15 MG tablet Take 15 mg by mouth daily. 08/17/19   [provider]  chlorpheniramine-HYDROcodone (TUSSIONEX PENNKINETIC ER) 10-8 MG/5ML SUER Take 5 mLs by mouth every 12 (twelve) hours as needed for cough. 05/23/20   Chase Picket, MD  fexofenadine [facility] ALLERGY) 180 MG tablet Take 1 tablet (180 mg total) by mouth daily for 15 days. 12/02/20 12/17/20  Eliezer Lofts, FNP  levothyroxine (SYNTHROID) 88 MCG tablet Take 88 mcg by mouth daily. 10/04/19   [provider]  lisinopril (ZESTRIL) 20 MG tablet Take 20 mg by mouth daily. 10/22/19   [provider]  nitrofurantoin, macrocrystal-monohydrate, (MACROBID) 100 MG capsule Take 1 capsule (100 mg total) by mouth 2 (two) times daily. 08/07/20   Raylene Everts, MD  omeprazole (PRILOSEC) 20 MG capsule Take 20 mg by mouth daily. 08/28/19   [provider]    Family History Family History  Problem Relation Age of Onset   AAA (abdominal  aortic aneurysm) Mother    Diabetes Mother    Heart disease Father    Healthy Sister    Healthy Brother    Hypertension Sister    Hypertension Sister    Thyroid disease Sister     Social History Social History   Tobacco Use   Smoking status: Never   Smokeless tobacco: Never  Vaping Use   Vaping Use: Never used  Substance Use Topics   Alcohol use: Not Currently    Alcohol/week: 1.0 standard drink    Types: 1 Glasses of wine per week   Drug use: Never     Allergies   Sulfa antibiotics   Review of Systems Review of Systems  HENT:  Positive for congestion and sore throat.   All other systems reviewed and are negative.   Physical Exam Triage Vital Signs ED Triage Vitals  Enc Vitals Group     BP 01/03/21 1354 (!) 144/91     Pulse Rate 01/03/21 1354 84     Resp 01/03/21 1354 18     Temp 01/03/21 1354 98.3 F (36.8 C)     Temp Source 01/03/21 1354 Oral     SpO2 01/03/21 1354 99 %     Weight --      Height --      Head Circumference --      Peak Flow --  Pain Score 01/03/21 1355 8     Pain Loc --      Pain Edu? --      Excl. in [place]? --    No data found.  Updated Vital Signs BP (!) 144/91 (BP Location: Right Arm)   Pulse 84   Temp 98.3 F (36.8 C) (Oral)   Resp 18   SpO2 99%   Physical Exam Vitals and nursing note reviewed.  Constitutional:      Appearance: She is well-developed and normal weight.  HENT:     Head: Normocephalic and atraumatic.     Right Ear: Tympanic membrane and ear canal normal.     Left Ear: Tympanic membrane and ear canal normal.     Mouth/Throat:     Mouth: Mucous membranes are moist. No oral lesions.     Pharynx: Oropharynx is clear. Uvula midline. No pharyngeal swelling, oropharyngeal exudate, posterior oropharyngeal erythema or uvula swelling.     Tonsils: No tonsillar exudate or tonsillar abscesses.  Eyes:     Conjunctiva/sclera: Conjunctivae normal.     Pupils: Pupils are equal, round, and reactive to light.   Cardiovascular:     Rate and Rhythm: Normal rate and regular rhythm.     Heart sounds: Normal heart sounds. No murmur heard. Pulmonary:     Effort: Pulmonary effort is normal.     Breath sounds: Normal breath sounds.     Comments: No adventitious breath sounds Musculoskeletal:     Cervical back: Normal range of motion and neck supple.  Skin:    General: Skin is warm and dry.  Neurological:     General: No focal deficit present.     Mental Status: She is alert and oriented to person, place, and time.  Psychiatric:        Mood and Affect: Mood normal.        Behavior: Behavior normal.     UC Treatments / Results  Labs (all labs ordered are listed, but only abnormal results are displayed) Labs Reviewed - No data to display  EKG   Radiology No results found.  Procedures Procedures (including critical care time)  Medications Ordered in UC Medications - No data to display  Initial Impression / Assessment and Plan / UC Course  I have reviewed the triage vital signs and the nursing notes.  Pertinent labs & imaging results that were available during my care of the patient were reviewed by me and considered in my medical decision making (see chart for details).     MDM: 1.  Sore throat-Rx'd Prednisone burst. Advised patient to take medication as directed with food to completion.  Encouraged patient to increase daily water intake while taking this medication.  Patient discharged home, hemodynamically stable. Final Clinical Impressions(s) / UC Diagnoses   Final diagnoses:  Sore throat     Discharge Instructions      Advised patient to take medication as directed with food to completion.  Encouraged patient to increase daily water intake while taking this medication.     ED Prescriptions     Medication Sig Dispense Auth. Provider   predniSONE (DELTASONE) 20 MG tablet Take 3 tabs PO x 5 days. 15 tablet Eliezer Lofts, FNP      PDMP not reviewed this encounter.    Eliezer Lofts, [place] 01/03/21 1437

## 2021-02-07 ENCOUNTER — Encounter: Payer: Self-pay | Admitting: Emergency Medicine

## 2021-02-07 ENCOUNTER — Emergency Department
Admission: EM | Admit: 2021-02-07 | Discharge: 2021-02-07 | Disposition: A | Discharge status: Home / Self Care | Payer: BC Managed Care – PPO | Source: Home / Self Care

## 2021-02-07 ENCOUNTER — Other Ambulatory Visit: Payer: Self-pay

## 2021-02-07 DIAGNOSIS — R350 Frequency of micturition: Secondary | ICD-10-CM | POA: Diagnosis not present

## 2021-02-07 LAB — POCT URINALYSIS DIP (MANUAL ENTRY)
Bilirubin, UA: NEGATIVE
Blood, UA: NEGATIVE
Glucose, UA: NEGATIVE mg/dL
Ketones, POC UA: NEGATIVE mg/dL
Nitrite, UA: NEGATIVE
Protein Ur, POC: NEGATIVE mg/dL
Spec Grav, UA: 1.02 (ref 1.010–1.025)
Urobilinogen, UA: 0.2 E.U./dL
pH, UA: 7 (ref 5.0–8.0)

## 2021-02-07 MED ORDER — NITROFURANTOIN MONOHYD MACRO 100 MG PO CAPS: 100.0000 mg | ORAL_CAPSULE | Freq: Two times a day (BID) | ORAL | 0 refills | Status: DC

## 2021-02-07 NOTE — Discharge Instructions (Addendum)
Advised patient to take medication as directed with food to completion.  Advised patient we will follow-up with urine culture results once received.

## 2021-02-07 NOTE — ED Triage Notes (Signed)
Noted blood in urine last night No hx of kidney stones  Denies burning  Bladder pressure  Tylenol last night

## 2021-02-07 NOTE — ED Provider Notes (Signed)
[name]    CSN: [id number] Arrival date & time: 02/07/21  0818      History   Chief Complaint Chief Complaint  Patient presents with   Hematuria    HPI Kelli Cooke is a 60 y.o. female.   HPI 60 year old female presents with hematuria and bladder pressure since last night.  Patient denies history of nephrolithiasis or dysuria.  Past Medical History:  Diagnosis Date   Hypertension    Thyroid disease     There are no problems to display for this patient.   Past Surgical History:  Procedure Laterality Date   ABDOMINAL HYSTERECTOMY     APPENDECTOMY      OB History   No obstetric history on file.      Home Medications    Prior to Admission medications   Medication Sig Start Date End Date Taking? Authorizing Provider  nitrofurantoin, macrocrystal-monohydrate, (MACROBID) 100 MG capsule Take 1 capsule (100 mg total) by mouth 2 (two) times daily. 02/07/21  Yes Eliezer Lofts, FNP  busPIRone (BUSPAR) 15 MG tablet Take 15 mg by mouth daily. 08/17/19   [provider]  chlorpheniramine-HYDROcodone (TUSSIONEX PENNKINETIC ER) 10-8 MG/5ML SUER Take 5 mLs by mouth every 12 (twelve) hours as needed for cough. Patient not taking: Reported on 02/07/2021 05/23/20   Chase Picket, MD  fexofenadine [facility] ALLERGY) 180 MG tablet Take 1 tablet (180 mg total) by mouth daily for 15 days. 12/02/20 12/17/20  Eliezer Lofts, FNP  levothyroxine (SYNTHROID) 50 MCG tablet Take 50 mcg by mouth every morning. 01/29/21   [provider]  levothyroxine (SYNTHROID) 88 MCG tablet Take 88 mcg by mouth daily. Patient not taking: Reported on 02/07/2021 10/04/19   [provider]  lisinopril (ZESTRIL) 20 MG tablet Take 20 mg by mouth daily. 10/22/19   [provider]  omeprazole (PRILOSEC) 20 MG capsule Take 20 mg by mouth daily. 08/28/19   [provider]  predniSONE (DELTASONE) 20 MG tablet Take 3 tabs PO x 5 days. Patient not taking:  Reported on 02/07/2021 01/03/21   Eliezer Lofts, FNP    Family History Family History  Problem Relation Age of Onset   AAA (abdominal aortic aneurysm) Mother    Diabetes Mother    Heart disease Father    Healthy Sister    Healthy Brother    Hypertension Sister    Hypertension Sister    Thyroid disease Sister     Social History Social History   Tobacco Use   Smoking status: Never   Smokeless tobacco: Never  Vaping Use   Vaping Use: Never used  Substance Use Topics   Alcohol use: Not Currently    Alcohol/week: 1.0 standard drink    Types: 1 Glasses of wine per week   Drug use: Never     Allergies   Sulfa antibiotics   Review of Systems Review of Systems  Genitourinary:  Positive for hematuria.  All other systems reviewed and are negative.   Physical Exam Triage Vital Signs ED Triage Vitals  Enc Vitals Group     BP 02/07/21 0844 137/88     Pulse Rate 02/07/21 0844 73     Resp 02/07/21 0844 15     Temp 02/07/21 0844 98 F (36.7 C)     Temp Source 02/07/21 0844 Oral     SpO2 02/07/21 0844 99 %     Weight 02/07/21 0849 145 lb (65.8 kg)     Height 02/07/21 0849 5' 3.5" (1.613  m)     Head Circumference --      Peak Flow --      Pain Score 02/07/21 0849 3     Pain Loc --      Pain Edu? --      Excl. in [place]? --    No data found.  Updated Vital Signs BP 137/88 (BP Location: Right Arm)   Pulse 73   Temp 98 F (36.7 C) (Oral)   Resp 15   Ht 5' 3.5" (1.613 m)   Wt 145 lb (65.8 kg)   SpO2 99%   BMI 25.28 kg/m    Physical Exam Vitals and nursing note reviewed.  Constitutional:      General: She is not in acute distress.    Appearance: Normal appearance. She is normal weight. She is not ill-appearing.  HENT:     Head: Normocephalic and atraumatic.     Right Ear: Tympanic membrane, ear canal and external ear normal.     Left Ear: Tympanic membrane, ear canal and external ear normal.     Mouth/Throat:     Mouth: Mucous membranes are moist.      Pharynx: Oropharynx is clear.  Eyes:     Extraocular Movements: Extraocular movements intact.     Conjunctiva/sclera: Conjunctivae normal.     Pupils: Pupils are equal, round, and reactive to light.  Cardiovascular:     Rate and Rhythm: Normal rate and regular rhythm.     Pulses: Normal pulses.     Heart sounds: Normal heart sounds.  Pulmonary:     Effort: Pulmonary effort is normal.     Breath sounds: Normal breath sounds.  Musculoskeletal:        General: Normal range of motion.     Cervical back: Normal range of motion and neck supple.  Skin:    General: Skin is warm and dry.  Neurological:     General: No focal deficit present.     Mental Status: She is alert and oriented to person, place, and time.     UC Treatments / Results  Labs (all labs ordered are listed, but only abnormal results are displayed) Labs Reviewed  POCT URINALYSIS DIP (MANUAL ENTRY) - Abnormal; Notable for the following components:      Result Value   Leukocytes, UA Trace (*)    All other components within normal limits  URINE CULTURE    EKG   Radiology No results found.  Procedures Procedures (including critical care time)  Medications Ordered in UC Medications - No data to display  Initial Impression / Assessment and Plan / UC Course  I have reviewed the triage vital signs and the nursing notes.  Pertinent labs & imaging results that were available during my care of the patient were reviewed by me and considered in my medical decision making (see chart for details).     MDM: 1.  Urinary frequency-Advised patient to take medication as directed with food to completion.  Advised patient we will follow-up with urine culture results once received.  Discharged home, hemodynamically stable.  Final Clinical Impressions(s) / UC Diagnoses   Final diagnoses:  Urinary frequency     Discharge Instructions      Advised patient to take medication as directed with food to completion.  Advised  patient we will follow-up with urine culture results once received.     ED Prescriptions     Medication Sig Dispense Auth. Provider   nitrofurantoin, macrocrystal-monohydrate, (MACROBID) 100 MG capsule Take  1 capsule (100 mg total) by mouth 2 (two) times daily. 10 capsule Eliezer Lofts, FNP      PDMP not reviewed this encounter.   Eliezer Lofts, [place] 02/07/21 [phone]

## 2021-02-09 DIAGNOSIS — E039 Hypothyroidism, unspecified: Secondary | ICD-10-CM | POA: Diagnosis not present

## 2021-02-09 LAB — URINE CULTURE
MICRO NUMBER:: 12631215
SPECIMEN QUALITY:: ADEQUATE

## 2021-03-07 DIAGNOSIS — R0981 Nasal congestion: Secondary | ICD-10-CM | POA: Diagnosis not present

## 2021-03-07 DIAGNOSIS — R509 Fever, unspecified: Secondary | ICD-10-CM | POA: Diagnosis not present

## 2021-03-07 DIAGNOSIS — Z20822 Contact with and (suspected) exposure to covid-19: Secondary | ICD-10-CM | POA: Diagnosis not present

## 2021-03-07 DIAGNOSIS — J3489 Other specified disorders of nose and nasal sinuses: Secondary | ICD-10-CM | POA: Diagnosis not present

## 2021-06-01 DIAGNOSIS — Z0000 Encounter for general adult medical examination without abnormal findings: Secondary | ICD-10-CM | POA: Diagnosis not present

## 2021-06-01 DIAGNOSIS — Z1331 Encounter for screening for depression: Secondary | ICD-10-CM | POA: Diagnosis not present

## 2021-06-01 DIAGNOSIS — Z6827 Body mass index (BMI) 27.0-27.9, adult: Secondary | ICD-10-CM | POA: Diagnosis not present

## 2021-06-01 DIAGNOSIS — E039 Hypothyroidism, unspecified: Secondary | ICD-10-CM | POA: Diagnosis not present

## 2021-06-01 DIAGNOSIS — I10 Essential (primary) hypertension: Secondary | ICD-10-CM | POA: Diagnosis not present

## 2021-06-15 ENCOUNTER — Telehealth: Payer: Self-pay | Admitting: Emergency Medicine

## 2021-06-15 ENCOUNTER — Emergency Department
Admission: EM | Admit: 2021-06-15 | Discharge: 2021-06-15 | Disposition: A | Discharge status: Home / Self Care | Payer: BC Managed Care – PPO | Source: Home / Self Care

## 2021-06-15 ENCOUNTER — Other Ambulatory Visit: Payer: Self-pay

## 2021-06-15 ENCOUNTER — Encounter: Payer: Self-pay | Admitting: Emergency Medicine

## 2021-06-15 DIAGNOSIS — J0100 Acute maxillary sinusitis, unspecified: Secondary | ICD-10-CM

## 2021-06-15 DIAGNOSIS — R059 Cough, unspecified: Secondary | ICD-10-CM | POA: Diagnosis not present

## 2021-06-15 DIAGNOSIS — J309 Allergic rhinitis, unspecified: Secondary | ICD-10-CM | POA: Diagnosis not present

## 2021-06-15 MED ORDER — AMOXICILLIN-POT CLAVULANATE 875-125 MG PO TABS: 1.0000 | ORAL_TABLET | Freq: Two times a day (BID) | ORAL | 0 refills | Status: AC

## 2021-06-15 MED ORDER — FEXOFENADINE HCL 180 MG PO TABS: 180.0000 mg | ORAL_TABLET | Freq: Every day | ORAL | 0 refills | Status: DC

## 2021-06-15 MED ORDER — PROMETHAZINE-DM 6.25-15 MG/5ML PO SYRP: 5.0000 mL | ORAL_SOLUTION | Freq: Two times a day (BID) | ORAL | 0 refills | Status: DC | PRN

## 2021-06-15 MED ORDER — PREDNISONE 20 MG PO TABS: ORAL_TABLET | ORAL | 0 refills | Status: DC

## 2021-06-15 MED ORDER — BENZONATATE 200 MG PO CAPS: 200.0000 mg | ORAL_CAPSULE | Freq: Three times a day (TID) | ORAL | 0 refills | Status: AC | PRN

## 2021-06-15 NOTE — ED Triage Notes (Signed)
Pt c/o sinus congestion, bloody nose when she wakes up and cough withy mucous x1 week. She has been taking OTC meds with no relief, negative at home covid test  ?

## 2021-06-15 NOTE — Discharge Instructions (Addendum)
Instructed patient to take medication as directed with food to completion.  Advised patient to take prednisone and Allegra with first dose of Augmentin for the next 5 of 10 days.  Advised may use Allegra as needed afterwards for concurrent postnasal drainage/drip.  Advised may use Tessalon Perles daily or as needed for cough.  Advised may use Promethazine DM at night prior to sleep for cough due to sedative effects.  Advised patient not to take Tessalon Perles and Promethazine DM together.  Encouraged increase daily water intake while taking these medications.  Advised patient if symptoms worsen and/or unresolved to please follow-up with PCP or here for further evaluation. ?

## 2021-06-15 NOTE — Telephone Encounter (Signed)
Promethazine DM sent to new pharmacy.  Patient called to report initial pharmacy did not have this medication in stock and requested Promethazine DM be sent to new pharmacy requested.  Patient has been notified that medication has been sent. ?

## 2021-06-15 NOTE — ED Provider Notes (Signed)
?[facility] ? ? ? ?CSN: [id number] ?Arrival date & time: 06/15/21  1228 ? ? ?  ? ?History   ?Chief Complaint ?Chief Complaint  ?Patient presents with  ? Cough  ? ? ?HPI ?Kelli Cooke is a 61 y.o. female.  ? ?HPI Pleasant 61 year old female presents with sinus congestion, bloody nose, cough with mucus for 1 week.  Patient reports negative home COVID-19 test today.  MH significant for HTN and thyroid disease.  Additionally, patient reports waking up with nosebleed of right nostril for 2 mornings.  Denies current epistaxis. ? ?Past Medical History:  ?Diagnosis Date  ? Hypertension   ? Thyroid disease   ? ? ?There are no problems to display for this patient. ? ? ?Past Surgical History:  ?Procedure Laterality Date  ? ABDOMINAL HYSTERECTOMY    ? APPENDECTOMY    ? ? ?OB History   ?No obstetric history on file. ?  ? ? ? ?Home Medications   ? ?Prior to Admission medications   ?Medication Sig Start Date End Date Taking? Authorizing Provider  ?amoxicillin-clavulanate (AUGMENTIN) 875-125 MG tablet Take 1 tablet by mouth every 12 (twelve) hours for 10 days. 06/15/21 06/25/21 Yes Eliezer Lofts, FNP  ?benzonatate (TESSALON) 200 MG capsule Take 1 capsule (200 mg total) by mouth 3 (three) times daily as needed for up to 7 days for cough. 06/15/21 06/22/21 Yes Eliezer Lofts, FNP  ?fexofenadine (ALLEGRA ALLERGY) 180 MG tablet Take 1 tablet (180 mg total) by mouth daily for 15 days. 06/15/21 06/30/21 Yes Eliezer Lofts, FNP  ?predniSONE (DELTASONE) 20 MG tablet Take 3 tabs PO daily x 5 days. 06/15/21  Yes Eliezer Lofts, FNP  ?promethazine-dextromethorphan (PROMETHAZINE-DM) 6.25-15 MG/5ML syrup Take 5 mLs by mouth 2 (two) times daily as needed for cough. 06/15/21  Yes Eliezer Lofts, FNP  ?busPIRone (BUSPAR) 15 MG tablet Take 15 mg by mouth daily. 08/17/19   [provider]  ?levothyroxine (SYNTHROID) 50 MCG tablet Take 50 mcg by mouth every morning. 01/29/21   [provider]  ?levothyroxine (SYNTHROID) 88 MCG  tablet Take 88 mcg by mouth daily. ?Patient not taking: Reported on 02/07/2021 10/04/19   [provider]  ?lisinopril (ZESTRIL) 20 MG tablet Take 20 mg by mouth daily. 10/22/19   [provider]  ?omeprazole (PRILOSEC) 20 MG capsule Take 20 mg by mouth daily. 08/28/19   [provider]  ? ? ?Family History ?Family History  ?Problem Relation Age of Onset  ? AAA (abdominal aortic aneurysm) Mother   ? Diabetes Mother   ? Heart disease Father   ? Healthy Sister   ? Healthy Brother   ? Hypertension Sister   ? Hypertension Sister   ? Thyroid disease Sister   ? ? ?Social History ?Social History  ? ?Tobacco Use  ? Smoking status: Never  ? Smokeless tobacco: Never  ?Vaping Use  ? Vaping Use: Never used  ?Substance Use Topics  ? Alcohol use: Not Currently  ?  Alcohol/week: 1.0 standard drink  ?  Types: 1 Glasses of wine per week  ? Drug use: Never  ? ? ? ?Allergies   ?Sulfa antibiotics ? ? ?Review of Systems ?Review of Systems  ?HENT:  Positive for congestion, postnasal drip, sinus pressure and sinus pain.   ?     Epistaxis x1 week  ?Respiratory:  Positive for cough.   ?All other systems reviewed and are negative. ? ? ?Physical Exam ?Triage Vital Signs ?ED Triage Vitals  ?Enc Vitals Group  ?  BP 06/15/21 1249 133/82  ?   Pulse Rate 06/15/21 1249 77  ?   Resp 06/15/21 1249 16  ?   Temp 06/15/21 1249 98.2 ?F (36.8 ?C)  ?   Temp Source 06/15/21 1249 Oral  ?   SpO2 06/15/21 1249 100 %  ?   Weight --   ?   Height --   ?   Head Circumference --   ?   Peak Flow --   ?   Pain Score 06/15/21 1251 0  ?   Pain Loc --   ?   Pain Edu? --   ?   Excl. in [place]? --   ? ?No data found. ? ?Updated Vital Signs ?BP 133/82 (BP Location: Right Arm)   Pulse 77   Temp 98.2 ?F (36.8 ?C) (Oral)   Resp 16   SpO2 100%  ? ? ?Physical Exam ?Vitals and nursing note reviewed.  ?Constitutional:   ?   General: She is not in acute distress. ?   Appearance: Normal appearance. She is normal weight. She is not ill-appearing.  ?HENT:  ?    Head: Normocephalic and atraumatic.  ?   Right Ear: Tympanic membrane and external ear normal.  ?   Left Ear: Tympanic membrane and external ear normal.  ?   Ears:  ?   Comments: Moderate eustachian tube dysfunction noted bilaterally ?   Nose:  ?   Comments: Turbinates are edematous/erythematous, right nostril with small amount of dried blood noted-leave from previous epistaxis  ?   Mouth/Throat:  ?   Mouth: Mucous membranes are moist.  ?   Pharynx: Oropharynx is clear.  ?   Comments: Moderate to significant amount of clear drainage of posterior oropharynx noted ?Eyes:  ?   Extraocular Movements: Extraocular movements intact.  ?   Conjunctiva/sclera: Conjunctivae normal.  ?   Pupils: Pupils are equal, round, and reactive to light.  ?Cardiovascular:  ?   Rate and Rhythm: Normal rate and regular rhythm.  ?   Pulses: Normal pulses.  ?   Heart sounds: Normal heart sounds. No murmur heard. ?Pulmonary:  ?   Effort: Pulmonary effort is normal.  ?   Breath sounds: Normal breath sounds. No wheezing, rhonchi or rales.  ?   Comments: Frequent nonproductive cough noted on exam ?Musculoskeletal:  ?   Cervical back: Normal range of motion and neck supple.  ?Skin: ?   General: Skin is warm and dry.  ?Neurological:  ?   General: No focal deficit present.  ?   Mental Status: She is alert and oriented to person, place, and time. Mental status is at baseline.  ? ? ? ?UC Treatments / Results  ?Labs ?(all labs ordered are listed, but only abnormal results are displayed) ?Labs Reviewed - No data to display ? ?EKG ? ? ?Radiology ?No results found. ? ?Procedures ?Procedures (including critical care time) ? ?Medications Ordered in UC ?Medications - No data to display ? ?Initial Impression / Assessment and Plan / UC Course  ?I have reviewed the triage vital signs and the nursing notes. ? ?Pertinent labs & imaging results that were available during my care of the patient were reviewed by me and considered in my medical decision making (see  chart for details). ? ?  ? ?MDM: 1.  Acute maxillary sinusitis-Rx'd Augmentin; 2.  Cough-asked prednisone, Tessalon Perles, and Promethazine DM; 3.  Allergic rhinitis-Allegra. Instructed patient to take medication as directed with food to completion.  Advised patient  to take prednisone and Allegra with first dose of Augmentin for the next 5 of 10 days.  Advised may use Allegra as needed afterwards for concurrent postnasal drainage/drip.  Advised may use Tessalon Perles daily or as needed for cough.  Advised may use Promethazine DM at night prior to sleep for cough due to sedative effects.  Advised patient not to take Tessalon Perles and Promethazine DM together.  Encouraged increase daily water intake while taking these medications.  Advised patient if symptoms worsen and/or unresolved to please follow-up with PCP or here for further evaluation.  Patient discharged home, hemodynamically stable. ?Final Clinical Impressions(s) / UC Diagnoses  ? ?Final diagnoses:  ?Cough, unspecified type  ?Acute maxillary sinusitis, recurrence not specified  ?Allergic rhinitis, unspecified seasonality, unspecified trigger  ? ? ? ?Discharge Instructions   ? ?  ?Instructed patient to take medication as directed with food to completion.  Advised patient to take prednisone and Allegra with first dose of Augmentin for the next 5 of 10 days.  Advised may use Allegra as needed afterwards for concurrent postnasal drainage/drip.  Advised may use Tessalon Perles daily or as needed for cough.  Advised may use Promethazine DM at night prior to sleep for cough due to sedative effects.  Advised patient not to take Tessalon Perles and Promethazine DM together.  Encouraged increase daily water intake while taking these medications.  Advised patient if symptoms worsen and/or unresolved to please follow-up with PCP or here for further evaluation. ? ? ? ? ?ED Prescriptions   ? ? Medication Sig Dispense Auth. Provider  ? amoxicillin-clavulanate  (AUGMENTIN) 875-125 MG tablet Take 1 tablet by mouth every 12 (twelve) hours for 10 days. 20 tablet Eliezer Lofts, FNP  ? predniSONE (DELTASONE) 20 MG tablet Take 3 tabs PO daily x 5 days. 15 tablet Eliezer Lofts

## 2021-07-06 DIAGNOSIS — Z1211 Encounter for screening for malignant neoplasm of colon: Secondary | ICD-10-CM | POA: Diagnosis not present

## 2021-07-06 DIAGNOSIS — Z1212 Encounter for screening for malignant neoplasm of rectum: Secondary | ICD-10-CM | POA: Diagnosis not present

## 2021-07-20 DIAGNOSIS — Z1231 Encounter for screening mammogram for malignant neoplasm of breast: Secondary | ICD-10-CM | POA: Diagnosis not present

## 2021-08-10 DIAGNOSIS — R920 Mammographic microcalcification found on diagnostic imaging of breast: Secondary | ICD-10-CM | POA: Diagnosis not present

## 2021-08-10 DIAGNOSIS — R928 Other abnormal and inconclusive findings on diagnostic imaging of breast: Secondary | ICD-10-CM | POA: Diagnosis not present

## 2021-08-10 DIAGNOSIS — R921 Mammographic calcification found on diagnostic imaging of breast: Secondary | ICD-10-CM | POA: Diagnosis not present

## 2021-08-16 DIAGNOSIS — J301 Allergic rhinitis due to pollen: Secondary | ICD-10-CM | POA: Diagnosis not present

## 2021-08-26 DIAGNOSIS — C50512 Malignant neoplasm of lower-outer quadrant of left female breast: Secondary | ICD-10-CM | POA: Diagnosis not present

## 2021-08-26 DIAGNOSIS — D0512 Intraductal carcinoma in situ of left breast: Secondary | ICD-10-CM | POA: Diagnosis not present

## 2021-08-26 DIAGNOSIS — N6489 Other specified disorders of breast: Secondary | ICD-10-CM | POA: Diagnosis not present

## 2021-08-26 DIAGNOSIS — R928 Other abnormal and inconclusive findings on diagnostic imaging of breast: Secondary | ICD-10-CM | POA: Diagnosis not present

## 2021-08-26 DIAGNOSIS — C50412 Malignant neoplasm of upper-outer quadrant of left female breast: Secondary | ICD-10-CM | POA: Diagnosis not present

## 2021-08-26 DIAGNOSIS — R920 Mammographic microcalcification found on diagnostic imaging of breast: Secondary | ICD-10-CM | POA: Diagnosis not present

## 2021-09-09 DIAGNOSIS — N6489 Other specified disorders of breast: Secondary | ICD-10-CM | POA: Diagnosis not present

## 2021-09-09 DIAGNOSIS — R928 Other abnormal and inconclusive findings on diagnostic imaging of breast: Secondary | ICD-10-CM | POA: Diagnosis not present

## 2021-09-11 DIAGNOSIS — Z170 Estrogen receptor positive status [ER+]: Secondary | ICD-10-CM | POA: Diagnosis not present

## 2021-09-11 DIAGNOSIS — C50412 Malignant neoplasm of upper-outer quadrant of left female breast: Secondary | ICD-10-CM | POA: Diagnosis not present

## 2021-09-26 HISTORY — PX: BREAST LUMPECTOMY: SHX2

## 2021-09-28 DIAGNOSIS — C50412 Malignant neoplasm of upper-outer quadrant of left female breast: Secondary | ICD-10-CM | POA: Diagnosis not present

## 2021-09-28 DIAGNOSIS — Z170 Estrogen receptor positive status [ER+]: Secondary | ICD-10-CM | POA: Diagnosis not present

## 2021-10-05 DIAGNOSIS — C50412 Malignant neoplasm of upper-outer quadrant of left female breast: Secondary | ICD-10-CM | POA: Diagnosis not present

## 2021-10-05 DIAGNOSIS — Z170 Estrogen receptor positive status [ER+]: Secondary | ICD-10-CM | POA: Diagnosis not present

## 2021-10-19 DIAGNOSIS — Z170 Estrogen receptor positive status [ER+]: Secondary | ICD-10-CM | POA: Diagnosis not present

## 2021-10-19 DIAGNOSIS — C50912 Malignant neoplasm of unspecified site of left female breast: Secondary | ICD-10-CM | POA: Diagnosis not present

## 2021-10-19 DIAGNOSIS — C50412 Malignant neoplasm of upper-outer quadrant of left female breast: Secondary | ICD-10-CM | POA: Diagnosis not present

## 2021-10-19 DIAGNOSIS — D0512 Intraductal carcinoma in situ of left breast: Secondary | ICD-10-CM | POA: Diagnosis not present

## 2021-10-27 DIAGNOSIS — C801 Malignant (primary) neoplasm, unspecified: Secondary | ICD-10-CM

## 2021-10-27 DIAGNOSIS — C50919 Malignant neoplasm of unspecified site of unspecified female breast: Secondary | ICD-10-CM

## 2021-10-27 HISTORY — DX: Malignant neoplasm of unspecified site of unspecified female breast: C50.919

## 2021-10-27 HISTORY — DX: Malignant (primary) neoplasm, unspecified: C80.1

## 2021-10-30 DIAGNOSIS — R928 Other abnormal and inconclusive findings on diagnostic imaging of breast: Secondary | ICD-10-CM | POA: Diagnosis not present

## 2021-11-02 DIAGNOSIS — C50512 Malignant neoplasm of lower-outer quadrant of left female breast: Secondary | ICD-10-CM | POA: Diagnosis not present

## 2021-11-02 DIAGNOSIS — C50912 Malignant neoplasm of unspecified site of left female breast: Secondary | ICD-10-CM | POA: Diagnosis not present

## 2021-11-02 DIAGNOSIS — Z170 Estrogen receptor positive status [ER+]: Secondary | ICD-10-CM | POA: Diagnosis not present

## 2021-11-20 DIAGNOSIS — R232 Flushing: Secondary | ICD-10-CM | POA: Diagnosis not present

## 2021-11-20 DIAGNOSIS — C50412 Malignant neoplasm of upper-outer quadrant of left female breast: Secondary | ICD-10-CM | POA: Diagnosis not present

## 2021-11-20 DIAGNOSIS — Z170 Estrogen receptor positive status [ER+]: Secondary | ICD-10-CM | POA: Diagnosis not present

## 2021-11-20 DIAGNOSIS — Z1501 Genetic susceptibility to malignant neoplasm of breast: Secondary | ICD-10-CM | POA: Diagnosis not present

## 2021-11-20 DIAGNOSIS — Z803 Family history of malignant neoplasm of breast: Secondary | ICD-10-CM | POA: Diagnosis not present

## 2021-11-20 DIAGNOSIS — R61 Generalized hyperhidrosis: Secondary | ICD-10-CM | POA: Diagnosis not present

## 2021-11-20 DIAGNOSIS — M2550 Pain in unspecified joint: Secondary | ICD-10-CM | POA: Diagnosis not present

## 2021-11-20 DIAGNOSIS — M8580 Other specified disorders of bone density and structure, unspecified site: Secondary | ICD-10-CM | POA: Diagnosis not present

## 2021-12-14 DIAGNOSIS — S99921A Unspecified injury of right foot, initial encounter: Secondary | ICD-10-CM | POA: Diagnosis not present

## 2021-12-14 DIAGNOSIS — W010XXA Fall on same level from slipping, tripping and stumbling without subsequent striking against object, initial encounter: Secondary | ICD-10-CM | POA: Diagnosis not present

## 2021-12-14 DIAGNOSIS — M19071 Primary osteoarthritis, right ankle and foot: Secondary | ICD-10-CM | POA: Diagnosis not present

## 2021-12-14 DIAGNOSIS — M2540 Effusion, unspecified joint: Secondary | ICD-10-CM | POA: Diagnosis not present

## 2021-12-14 DIAGNOSIS — M25561 Pain in right knee: Secondary | ICD-10-CM | POA: Diagnosis not present

## 2021-12-14 DIAGNOSIS — S8991XA Unspecified injury of right lower leg, initial encounter: Secondary | ICD-10-CM | POA: Diagnosis not present

## 2021-12-14 DIAGNOSIS — R600 Localized edema: Secondary | ICD-10-CM | POA: Diagnosis not present

## 2021-12-14 DIAGNOSIS — M79644 Pain in right finger(s): Secondary | ICD-10-CM | POA: Diagnosis not present

## 2021-12-14 DIAGNOSIS — M2550 Pain in unspecified joint: Secondary | ICD-10-CM | POA: Diagnosis not present

## 2021-12-18 DIAGNOSIS — C50412 Malignant neoplasm of upper-outer quadrant of left female breast: Secondary | ICD-10-CM | POA: Diagnosis not present

## 2021-12-18 DIAGNOSIS — M8589 Other specified disorders of bone density and structure, multiple sites: Secondary | ICD-10-CM | POA: Diagnosis not present

## 2021-12-18 DIAGNOSIS — Z780 Asymptomatic menopausal state: Secondary | ICD-10-CM | POA: Diagnosis not present

## 2021-12-18 DIAGNOSIS — C50912 Malignant neoplasm of unspecified site of left female breast: Secondary | ICD-10-CM | POA: Diagnosis not present

## 2021-12-18 DIAGNOSIS — Z170 Estrogen receptor positive status [ER+]: Secondary | ICD-10-CM | POA: Diagnosis not present

## 2021-12-24 ENCOUNTER — Encounter: Payer: Self-pay | Admitting: Gastroenterology

## 2022-01-06 ENCOUNTER — Ambulatory Visit (AMBULATORY_SURGERY_CENTER): Payer: Self-pay

## 2022-01-06 VITALS — Ht 63.5 in | Wt 158.0 lb

## 2022-01-06 DIAGNOSIS — Z1211 Encounter for screening for malignant neoplasm of colon: Secondary | ICD-10-CM

## 2022-01-06 MED ORDER — NA SULFATE-K SULFATE-MG SULF 17.5-3.13-1.6 GM/177ML PO SOLN: 1.0000 | Freq: Once | ORAL | 0 refills | Status: AC

## 2022-01-06 NOTE — Progress Notes (Signed)
No egg or soy allergy known to patient  No issues known to pt with past sedation with any surgeries or procedures Patient denies ever being told they had issues or difficulty with intubation  No FH of Malignant Hyperthermia Pt is not on diet pills Pt is not on home 02  Pt is not on blood thinners  Pt reports issues with constipation --patient advised to increase water intake, activity, fruits/veggies; take OTC meds  (Miralax and/or Dulcolax) if needed for constipation; No A fib or A flutter Have any cardiac testing pending--NO Pt instructed to use [email or web address] or GoodRx for a price reduction on prep  Insurance verified during PV appt=[employer]BS PPO

## 2022-01-25 ENCOUNTER — Encounter: Payer: Self-pay | Admitting: Gastroenterology

## 2022-02-01 ENCOUNTER — Encounter: Payer: Self-pay | Admitting: Gastroenterology

## 2022-02-01 ENCOUNTER — Ambulatory Visit (AMBULATORY_SURGERY_CENTER): Payer: BC Managed Care – PPO | Admitting: Gastroenterology

## 2022-02-01 VITALS — BP 123/63 | HR 65 | Temp 97.7°F | Resp 12 | Ht 63.5 in | Wt 158.0 lb

## 2022-02-01 DIAGNOSIS — Z1211 Encounter for screening for malignant neoplasm of colon: Secondary | ICD-10-CM

## 2022-02-01 DIAGNOSIS — D128 Benign neoplasm of rectum: Secondary | ICD-10-CM | POA: Diagnosis not present

## 2022-02-01 MED ORDER — SODIUM CHLORIDE 0.9 % IV SOLN: 500.0000 mL | INTRAVENOUS | Status: DC

## 2022-02-01 NOTE — Progress Notes (Signed)
[facility] History and Physical   Primary Care Physician:  Townsend Roger, MD   Reason for Procedure:   CRC screening  Plan:    colon     HPI: Kelli Cooke is a 61 y.o. female  + CHEK2  No nausea, vomiting, heartburn, regurgitation, odynophagia or dysphagia.  No significant diarrhea or constipation.  No melena or hematochezia. No unintentional weight loss. No abdominal pain.  Past Medical History:  Diagnosis Date   Anxiety    on meds   Breast cancer ([place]) 10/2021   LEFT breast   Cancer ([place]) 10/2021   LEFT breast   Depression    on meds   GERD (gastroesophageal reflux disease)    on meds   Heart murmur    Hypertension    on meds   PONV (postoperative nausea and vomiting)    Thyroid disease    on meds    Past Surgical History:  Procedure Laterality Date   ABDOMINAL HYSTERECTOMY     APPENDECTOMY     BREAST LUMPECTOMY Left 09/2021   due to breast cancer   [place]    Prior to Admission medications   Medication Sig Start Date End Date Taking? Authorizing Provider  busPIRone (BUSPAR) 10 MG tablet Take 10 mg by mouth 2 (two) times daily. 08/17/19  Yes [provider]  EQ NASAL ALLERGY 55 MCG/ACT AERO nasal inhaler Place 1 spray into the nose daily as needed. 08/16/21  Yes [provider]  levothyroxine (SYNTHROID) 50 MCG tablet Take 50 mcg by mouth every morning. 01/29/21  Yes [provider]  lisinopril (ZESTRIL) 20 MG tablet Take 20 mg by mouth daily. 10/22/19  Yes [provider]  omeprazole (PRILOSEC) 20 MG capsule Take 20 mg by mouth daily. 08/28/19  Yes [provider]  Zinc Oxide-Vitamin C (ZINC PLUS VITAMIN C PO) Take 1,000 mg by mouth daily at 6 (six) AM.   Yes [provider]  tamoxifen (NOLVADEX) 20 MG tablet Take 20 mg by mouth daily. 12/18/21   [provider]    Current Outpatient  Medications  Medication Sig Dispense Refill   busPIRone (BUSPAR) 10 MG tablet Take 10 mg by mouth 2 (two) times daily.     EQ NASAL ALLERGY 55 MCG/ACT AERO nasal inhaler Place 1 spray into the nose daily as needed.     levothyroxine (SYNTHROID) 50 MCG tablet Take 50 mcg by mouth every morning.     lisinopril (ZESTRIL) 20 MG tablet Take 20 mg by mouth daily.     omeprazole (PRILOSEC) 20 MG capsule Take 20 mg by mouth daily.     Zinc Oxide-Vitamin C (ZINC PLUS VITAMIN C PO) Take 1,000 mg by mouth daily at 6 (six) AM.     tamoxifen (NOLVADEX) 20 MG tablet Take 20 mg by mouth daily.     Current Facility-Administered Medications  Medication Dose Route Frequency Provider Last Rate Last Admin   0.9 %  sodium chloride infusion  500 mL Intravenous Continuous Jackquline Denmark, MD        Allergies as of 02/01/2022 - Review Complete 02/01/2022  Allergen Reaction Noted   Latex Itching 09/28/2021   Montelukast  03/07/2021   Sulfa antibiotics Rash 03/25/2020    Family History  Problem Relation Age of Onset   AAA (abdominal aortic aneurysm) Mother    Diabetes Mother    Heart  disease Father    Healthy Sister    Hypertension Sister    Hypertension Sister    Thyroid disease Sister    Healthy Brother    Pancreatic cancer Maternal Aunt    Colon polyps Maternal Aunt    Colon cancer Maternal Aunt    Colon cancer Maternal Grandmother    Colon polyps Maternal Grandmother    Esophageal cancer Neg Hx    Rectal cancer Neg Hx    Stomach cancer Neg Hx     Social History   Socioeconomic History   Marital status: Married    Spouse name: Not on file   Number of children: Not on file   Years of education: Not on file   Highest education level: Not on file  Occupational History   Not on file  Tobacco Use   Smoking status: Never   Smokeless tobacco: Never  Vaping Use   Vaping Use: Never used  Substance and Sexual Activity   Alcohol use: Not Currently    Alcohol/week: 1.0 standard drink of alcohol     Types: 1 Glasses of wine per week   Drug use: Never   Sexual activity: Yes    Birth control/protection: None    Comment: same partner  Other Topics Concern   Not on file  Social History Narrative   Not on file   Social Determinants of Health   Financial Resource Strain: Not on file  Food Insecurity: Not on file  Transportation Needs: Not on file  Physical Activity: Not on file  Stress: Not on file  Social Connections: Not on file  Intimate Partner Violence: Not on file    Review of Systems: Positive for none All other review of systems negative except as mentioned in the HPI.  Physical Exam: Vital signs in last 24 hours: _0 @   General:   Alert,  Well-developed, well-nourished, pleasant and cooperative in NAD Lungs:  Clear throughout to auscultation.   Heart:  Regular rate and rhythm; no murmurs, clicks, rubs,  or gallops. Abdomen:  Soft, nontender and nondistended. Normal bowel sounds.   Neuro/Psych:  Alert and cooperative. Normal mood and affect. A and O x 3    No significant changes were identified.  The patient continues to be an appropriate candidate for the planned procedure and anesthesia.   Carmell Austria, MD. [facility] 02/01/2022 8:41 AM@

## 2022-02-01 NOTE — Op Note (Signed)
[place] Patient Name: Kelli Cooke Procedure Date: 02/01/2022 8:39 AM MRN: [id number] Endoscopist: Jackquline Denmark , MD, [id number] Age: 61 Referring MD:  Date of Birth: [birth year] Gender: Female Account #: [id number] Procedure:                Colonoscopy Indications:              Screening for colorectal malignant neoplasm Medicines:                Monitored Anesthesia Care Procedure:                Pre-Anesthesia Assessment:                           - Prior to the procedure, a History and Physical                            was performed, and patient medications and                            allergies were reviewed. The patient's tolerance of                            previous anesthesia was also reviewed. The risks                            and benefits of the procedure and the sedation                            options and risks were discussed with the patient.                            All questions were answered, and informed consent                            was obtained. Prior Anticoagulants: The patient has                            taken no anticoagulant or antiplatelet agents. ASA                            Grade Assessment: II - A patient with mild systemic                            disease. After reviewing the risks and benefits,                            the patient was deemed in satisfactory condition to                            undergo the procedure.                           After obtaining informed consent, the colonoscope  was passed under direct vision. Throughout the                            procedure, the patient's blood pressure, pulse, and                            oxygen saturations were monitored continuously. The                            PCF-HQ190L Colonoscope was introduced through the                            anus and advanced to the 2 cm into the ileum. The                            colonoscopy  was performed without difficulty. The                            patient tolerated the procedure well. The quality                            of the bowel preparation was good. The terminal                            ileum, ileocecal valve, appendiceal orifice, and                            rectum were photographed. Scope In: 8:43:53 AM Scope Out: 8:58:38 AM Scope Withdrawal Time: 0 hours 10 minutes 26 seconds  Total Procedure Duration: 0 hours 14 minutes 45 seconds  Findings:                 A 6 mm polyp was found in the distal rectum. The                            polyp was sessile. The polyp was removed with a                            cold snare. Resection and retrieval were complete.                           Non-bleeding external and internal hemorrhoids were                            found during retroflexion and during perianal exam.                            The hemorrhoids were small and Grade I (internal                            hemorrhoids that do not prolapse).                           The terminal ileum appeared  normal.                           The exam was otherwise without abnormality on                            direct and retroflexion views. Complications:            No immediate complications. Estimated Blood Loss:     Estimated blood loss: none. Impression:               - One 6 mm polyp in the distal rectum, removed with                            a cold snare. Resected and retrieved.                           - Non-bleeding external and internal hemorrhoids.                           - The examined portion of the ileum was normal.                           - The examination was otherwise normal on direct                            and retroflexion views. Recommendation:           - Patient has a contact number available for                            emergencies. The signs and symptoms of potential                            delayed complications were  discussed with the                            patient. Return to normal activities tomorrow.                            Written discharge instructions were provided to the                            patient.                           - Resume previous diet.                           - Continue present medications.                           - Await pathology results.                           - Repeat colonoscopy in 5 years for screening  purposes. Earlier if with any new problems or                            change in [place].                           - The findings and recommendations were discussed                            with the patient's family. Jackquline Denmark, MD 02/01/2022 9:02:36 AM This report has been signed electronically.

## 2022-02-01 NOTE — Progress Notes (Signed)
Called to room to assist during endoscopic procedure.  Patient ID and intended procedure confirmed with present staff. Received instructions for my participation in the procedure from the performing physician.  

## 2022-02-01 NOTE — Patient Instructions (Signed)
Read all of the handouts given to you by your recovery room nurse.  Resume your previous medications.  YOU HAD AN ENDOSCOPIC PROCEDURE TODAY AT [facility]:   Refer to the procedure report that was given to you for any specific questions about what was found during the examination.  If the procedure report does not answer your questions, please call your gastroenterologist to clarify.  If you requested that your care partner not be given the details of your procedure findings, then the procedure report has been included in a sealed envelope for you to review at your convenience later.  YOU SHOULD EXPECT: Some feelings of bloating in the abdomen. Passage of more gas than usual.  Walking can help get rid of the air that was put into your GI tract during the procedure and reduce the bloating. If you had a lower endoscopy (such as a colonoscopy or flexible sigmoidoscopy) you may notice spotting of blood in your stool or on the toilet paper. If you underwent a bowel prep for your procedure, you may not have a normal bowel movement for a few days.  Please Note:  You might notice some irritation and congestion in your nose or some drainage.  This is from the oxygen used during your procedure.  There is no need for concern and it should clear up in a day or so.  SYMPTOMS TO REPORT IMMEDIATELY:  Following lower endoscopy (colonoscopy or flexible sigmoidoscopy):  Excessive amounts of blood in the stool  Significant tenderness or worsening of abdominal pains  Swelling of the abdomen that is new, acute  Fever of 100F or higher   For urgent or emergent issues, a gastroenterologist can be reached at any hour by calling [phone]. Do not use MyChart messaging for urgent concerns.    DIET:  We do recommend a small meal at first, but then you may proceed to your regular diet.  Drink plenty of fluids but you should avoid alcoholic beverages for 24 hours.  ACTIVITY:  You should plan to  take it easy for the rest of today and you should NOT DRIVE or use heavy machinery until tomorrow (because of the sedation medicines used during the test).    FOLLOW UP: Our staff will call the number listed on your records the next business day following your procedure.  We will call around 7:15- 8:00 am to check on you and address any questions or concerns that you may have regarding the information given to you following your procedure. If we do not reach you, we will leave a message.     If any biopsies were taken you will be contacted by phone or by letter within the next 1-3 weeks.  Please call us at [phone] if you have not heard about the biopsies in 3 weeks.    SIGNATURES/CONFIDENTIALITY: You and/or your care partner have signed paperwork which will be entered into your electronic medical record.  These signatures attest to the fact that that the information above on your After Visit Summary has been reviewed and is understood.  Full responsibility of the confidentiality of this discharge information lies with you and/or your care-partner.

## 2022-02-01 NOTE — Progress Notes (Signed)
Report to PACU, RN, vss, BBS= Clear.  

## 2022-02-01 NOTE — Progress Notes (Signed)
Pt's states no medical or surgical changes since previsit or office visit. 

## 2022-02-02 ENCOUNTER — Telehealth: Payer: Self-pay | Admitting: *Deleted

## 2022-02-02 NOTE — Telephone Encounter (Signed)
  Follow up Call-     02/01/2022    7:35 AM  Call back number  Post procedure Call Back phone  # [phone]  Permission to leave phone message Yes     Patient questions:  Do you have a fever, pain , or abdominal swelling? No. Pain Score  0 *  Have you tolerated food without any problems? Yes.    Have you been able to return to your normal activities? Yes.    Do you have any questions about your discharge instructions: Diet   No. Medications  No. Follow up visit  No.  Do you have questions or concerns about your Care? No.  Actions: * If pain score is 4 or above: No action needed, pain <4.

## 2022-02-14 ENCOUNTER — Encounter: Payer: Self-pay | Admitting: Gastroenterology

## 2022-03-08 DIAGNOSIS — J019 Acute sinusitis, unspecified: Secondary | ICD-10-CM | POA: Diagnosis not present

## 2022-03-08 DIAGNOSIS — R42 Dizziness and giddiness: Secondary | ICD-10-CM | POA: Diagnosis not present

## 2022-03-08 DIAGNOSIS — R030 Elevated blood-pressure reading, without diagnosis of hypertension: Secondary | ICD-10-CM | POA: Diagnosis not present

## 2022-03-08 DIAGNOSIS — Z6826 Body mass index (BMI) 26.0-26.9, adult: Secondary | ICD-10-CM | POA: Diagnosis not present

## 2022-04-14 DIAGNOSIS — J019 Acute sinusitis, unspecified: Secondary | ICD-10-CM | POA: Diagnosis not present

## 2022-04-14 DIAGNOSIS — R42 Dizziness and giddiness: Secondary | ICD-10-CM | POA: Diagnosis not present

## 2022-04-14 DIAGNOSIS — R110 Nausea: Secondary | ICD-10-CM | POA: Diagnosis not present

## 2022-04-19 ENCOUNTER — Encounter: Payer: Self-pay | Admitting: Internal Medicine

## 2022-04-19 ENCOUNTER — Ambulatory Visit: Payer: BC Managed Care – PPO | Admitting: Internal Medicine

## 2022-04-19 VITALS — BP 130/88 | HR 77 | Temp 98.1°F | Resp 16 | Ht 63.5 in | Wt 152.0 lb

## 2022-04-19 DIAGNOSIS — Z1231 Encounter for screening mammogram for malignant neoplasm of breast: Secondary | ICD-10-CM | POA: Diagnosis not present

## 2022-04-19 DIAGNOSIS — R42 Dizziness and giddiness: Secondary | ICD-10-CM | POA: Insufficient documentation

## 2022-04-19 MED ORDER — LEVOTHYROXINE SODIUM 50 MCG PO TABS: 50.0000 ug | ORAL_TABLET | Freq: Every morning | ORAL | 3 refills | Status: DC

## 2022-04-19 NOTE — Addendum Note (Signed)
Addended by: Townsend Roger on: 04/19/2022 04:46 PM   Modules accepted: Orders

## 2022-04-19 NOTE — Progress Notes (Signed)
Office Visit  Subjective   Patient ID: KRYSTIAN FERRENTINO   DOB: 1960-04-19   Age: 62 y.o.   MRN: [id number]   Chief Complaint Chief Complaint  Patient presents with   Follow-up    Needs referral for follow up mammogram     History of Present Illness The patient is a 62 year old Caucasian/White female who presents for a follow-up evaluation of hypertension. On her last visit, her BP was borderline. The patient has not been checking her blood pressure at home. The patient's current medications include: lisinopril '20mg'$  daily. The patient has been tolerating her medications well. The patient denies any headache, visual changes, dizziness, lightheadness, chest pain, shortness of breath, weakness/numbness, and edema. She reports there have been no other symptoms noted.   The patient also tells me she has vertigo that has been going on for 7 weeks.  She has the feeling of the room spinning around her.  This has been continuous for the last 7 weeks where today she is improved.  There is no tinnitus or hearing loss at this time.  There is no focal weakness/numbness at this time.  She has been having sinus congestion/pressure that started a week ago. There has been no ear pressure or popping.  She is having clear nasal discharge with postnasal drip.  The patient went to urgent care about 5-6 days ago and she was placed on an antibiotic with augmentin and steriod injection and meclizine.  She is on flonase at home but not zyrtec.     She does have a history of seasonal allergies that has been effecting her most of her life.  The spring and fall are the worse periods.  She states she gets nasal congestion with post nasal drip and sore throat with cough at night time.   She has been on singulair in the past but this interacted with her thyroid meds.  She is currently on nasal saline washes, Flonase and Zyrtec.  The patient also has a history of GERD which is controlled on omeprazole at this time.              Past Medical History Past Medical History:  Diagnosis Date   Anxiety    on meds   Breast cancer ([place]) 10/2021   LEFT breast   Depression    on meds   GERD (gastroesophageal reflux disease)    on meds   Heart murmur    Hiatal hernia    Hypertension    on meds   Hypothyroidism    Seasonal allergies      Allergies Allergies  Allergen Reactions   Latex Itching   Montelukast     Other reaction(s): Dizziness (intolerance)   Sulfa Antibiotics Rash     Medications  Current Outpatient Medications:    amoxicillin-clavulanate (AUGMENTIN) 875-125 MG tablet, Take 1 tablet by mouth 2 (two) times daily., Disp: , Rfl:    meclizine (ANTIVERT) 25 MG tablet, Take 25 mg by mouth 3 (three) times daily., Disp: , Rfl:    busPIRone (BUSPAR) 10 MG tablet, Take 10 mg by mouth 2 (two) times daily., Disp: , Rfl:    EQ NASAL ALLERGY 55 MCG/ACT AERO nasal inhaler, Place 1 spray into the nose daily as needed., Disp: , Rfl:    levothyroxine (SYNTHROID) 50 MCG tablet, Take 50 mcg by mouth every morning., Disp: , Rfl:    lisinopril (ZESTRIL) 20 MG tablet, Take 20 mg by mouth daily., Disp: , Rfl:  omeprazole (PRILOSEC) 20 MG capsule, Take 20 mg by mouth daily., Disp: , Rfl:    Zinc Oxide-Vitamin C (ZINC PLUS VITAMIN C PO), Take 1,000 mg by mouth daily at 6 (six) AM., Disp: , Rfl:    Review of Systems Review of Systems  Constitutional:  Negative for chills and fever.  HENT:  Positive for congestion. Negative for hearing loss, sore throat and tinnitus.   Eyes:  Negative for blurred vision and double vision.  Respiratory:  Positive for cough. Negative for shortness of breath and wheezing.   Cardiovascular:  Negative for chest pain, palpitations and leg swelling.  Gastrointestinal:  Negative for abdominal pain, constipation, diarrhea, nausea and vomiting.  Musculoskeletal:  Negative for myalgias.  Skin:  Negative for itching and rash.  Neurological:  Positive for dizziness. Negative for  weakness and headaches.       Objective:    Vitals BP 130/88   Pulse 77   Temp 98.1 F (36.7 C)   Resp 16   Ht 5' 3.5" (1.613 m)   Wt 152 lb (68.9 kg)   SpO2 98%   BMI 26.50 kg/m    Physical Examination Physical Exam Constitutional:      Appearance: Normal appearance. She is not ill-appearing.  Cardiovascular:     Rate and Rhythm: Normal rate and regular rhythm.     Pulses: Normal pulses.     Heart sounds: No murmur heard.    No friction rub. No gallop.  Pulmonary:     Effort: Pulmonary effort is normal. No respiratory distress.     Breath sounds: No wheezing, rhonchi or rales.  Abdominal:     General: Abdomen is flat. Bowel sounds are normal. There is no distension.     Palpations: Abdomen is soft.     Tenderness: There is no abdominal tenderness.  Musculoskeletal:     Right lower leg: No edema.     Left lower leg: No edema.  Skin:    General: Skin is warm and dry.     Findings: No rash.  Neurological:     General: No focal deficit present.     Mental Status: She is alert.     Comments: CN II-XII are fully intact.  Strength 5/5 throughout.  Her DTR's are 2+ throughout, she has normal past pointing and rapid alternating movements and normal heel to shin.  Her romberg sign was normal.  She has no nystagmus.        Assessment & Plan:   Vertigo Her neuro exam is normal.  I see no effusions behind her ears but she is having sinus congestion.  I want her to finish her antibiotic and start back on flonase and zyrtec.  We will refer her to vestibular rehab at this time for BPPV.    No follow-ups on file.   Townsend Roger, MD

## 2022-04-19 NOTE — Assessment & Plan Note (Signed)
Her neuro exam is normal.  I see no effusions behind her ears but she is having sinus congestion.  I want her to finish her antibiotic and start back on flonase and zyrtec.  We will refer her to vestibular rehab at this time for BPPV.

## 2022-04-22 DIAGNOSIS — H8111 Benign paroxysmal vertigo, right ear: Secondary | ICD-10-CM | POA: Diagnosis not present

## 2022-04-27 DIAGNOSIS — H8111 Benign paroxysmal vertigo, right ear: Secondary | ICD-10-CM | POA: Diagnosis not present

## 2022-05-11 ENCOUNTER — Other Ambulatory Visit: Payer: Self-pay | Admitting: Internal Medicine

## 2022-05-17 DIAGNOSIS — N3001 Acute cystitis with hematuria: Secondary | ICD-10-CM | POA: Diagnosis not present

## 2022-06-23 DIAGNOSIS — J329 Chronic sinusitis, unspecified: Secondary | ICD-10-CM | POA: Diagnosis not present

## 2022-07-07 DIAGNOSIS — Z853 Personal history of malignant neoplasm of breast: Secondary | ICD-10-CM | POA: Diagnosis not present

## 2022-07-07 DIAGNOSIS — Z1231 Encounter for screening mammogram for malignant neoplasm of breast: Secondary | ICD-10-CM | POA: Diagnosis not present

## 2022-07-16 ENCOUNTER — Encounter: Payer: Self-pay | Admitting: Internal Medicine

## 2022-07-16 ENCOUNTER — Ambulatory Visit: Payer: BC Managed Care – PPO | Admitting: Internal Medicine

## 2022-07-16 VITALS — BP 138/88 | HR 71 | Temp 97.7°F | Resp 16 | Ht 63.5 in | Wt 154.6 lb

## 2022-07-16 DIAGNOSIS — I10 Essential (primary) hypertension: Secondary | ICD-10-CM

## 2022-07-16 DIAGNOSIS — E039 Hypothyroidism, unspecified: Secondary | ICD-10-CM | POA: Insufficient documentation

## 2022-07-16 MED ORDER — BUSPIRONE HCL 10 MG PO TABS: 10.0000 mg | ORAL_TABLET | Freq: Two times a day (BID) | ORAL | 3 refills | Status: DC

## 2022-07-16 MED ORDER — LISINOPRIL 20 MG PO TABS: 20.0000 mg | ORAL_TABLET | Freq: Every day | ORAL | 3 refills | Status: DC

## 2022-07-16 MED ORDER — OMEPRAZOLE 20 MG PO CPDR: 20.0000 mg | DELAYED_RELEASE_CAPSULE | Freq: Every day | ORAL | 3 refills | Status: AC

## 2022-07-16 NOTE — Assessment & Plan Note (Signed)
She seems euthyroid.  We will check her TFT's today. 

## 2022-07-16 NOTE — Assessment & Plan Note (Signed)
Her BP is controlled today.  We will continue her dose of lisinopril.  I willl see her back in 3 months for a yearly exam.

## 2022-07-16 NOTE — Progress Notes (Signed)
Office Visit  Subjective   Patient ID: Kelli Cooke   DOB: [birth year]   Age: 62 y.o.   MRN: [id number]   Chief Complaint Chief Complaint  Patient presents with   Follow-up    Hypertension     History of Present Illness The patient is a 62 year old Caucasian/White female who presents for a follow-up evaluation of hypertension. Since her last visit, she has not had any problems.. The patient has not been checking her blood pressure at home. The patient's current medications include: lisinopril  daily. The patient has been tolerating her medications well. The patient denies any headache, visual changes, dizziness, lightheadness, chest pain, shortness of breath, weakness/numbness, and edema. She reports there have been no other symptoms noted.    The patient is a 62 year old Caucasian/White female who returns for a regularly scheduled thyroid check.  Early last year, her Free T4 was elevated and she was having some muscle cramping but otherwise asymptomatic in regards to her thyroid.  I did cut her back to levothyroxine po daily.    She states at age of 62 she was having syncopal episodes and heart palpitations and she had her thyroid checked and it was low.  She claims to have no symptoms suggestive of thyroid imbalance specifically denying fatigue, cold intolerance, heat intolerance, tremors, unexplained weight changes, dry skin, constipation and insomnia.       Past Medical History Past Medical History:  Diagnosis Date   Anxiety    on meds   Breast cancer 10/2021   LEFT breast   Depression    on meds   GERD (gastroesophageal reflux disease)    on meds   Heart murmur    Hiatal hernia    Hypertension    on meds   Hypothyroidism    Seasonal allergies      Allergies Allergies  Allergen Reactions   Latex Itching   Montelukast     Other reaction(s): Dizziness (intolerance)   Sulfa Antibiotics Rash     Medications  Current Outpatient Medications:     busPIRone (BUSPAR) 10 MG tablet, Take 1 tablet by mouth twice daily, Disp: 180 tablet, Rfl: 0   EQ NASAL ALLERGY 55 MCG/ACT AERO nasal inhaler, Place 1 spray into the nose daily as needed., Disp: , Rfl:    levothyroxine (SYNTHROID) 50 MCG tablet, Take 1 tablet (50 mcg total) by mouth every morning., Disp: 90 tablet, Rfl: 3   lisinopril (ZESTRIL) 20 MG tablet, Take 1 tablet by mouth once daily, Disp: 90 tablet, Rfl: 0   omeprazole (PRILOSEC) 20 MG capsule, Take 20 mg by mouth daily., Disp: , Rfl:    Zinc Oxide-Vitamin C (ZINC PLUS VITAMIN C PO), Take 1,000 mg by mouth daily at 6 (six) AM., Disp: , Rfl:    Review of Systems Review of Systems  Constitutional:  Negative for chills, diaphoresis, fever and malaise/fatigue.  Eyes:  Negative for blurred vision and double vision.  Respiratory:  Negative for cough and shortness of breath.   Cardiovascular:  Negative for chest pain, palpitations and leg swelling.  Gastrointestinal:  Negative for abdominal pain, constipation, diarrhea, nausea and vomiting.  Musculoskeletal:  Negative for myalgias.  Skin:  Negative for itching and rash.  Neurological:  Negative for dizziness, tremors, weakness and headaches.       Objective:    Vitals BP 138/88   Pulse 71   Temp 97.7 F (36.5 C)   Resp 16   Ht 5' 3.5" (  1.613 m)   Wt 154 lb 9.6 oz (70.1 kg)   SpO2 97%   BMI 26.96 kg/m    Physical Examination Physical Exam Constitutional:      Appearance: Normal appearance. She is not ill-appearing.  Cardiovascular:     Rate and Rhythm: Normal rate and regular rhythm.     Pulses: Normal pulses.     Heart sounds: No murmur heard.    No friction rub. No gallop.  Pulmonary:     Effort: Pulmonary effort is normal. No respiratory distress.     Breath sounds: No wheezing, rhonchi or rales.  Abdominal:     General: Bowel sounds are normal. There is no distension.     Palpations: Abdomen is soft.     Tenderness: There is no abdominal tenderness.   Musculoskeletal:     Right lower leg: No edema.     Left lower leg: No edema.  Skin:    General: Skin is warm and dry.     Findings: No rash.  Neurological:     Mental Status: She is alert.        Assessment & Plan:   Essential hypertension Her BP is controlled today.  We will continue her dose of lisinopril.  I willl see her back in 3 months for a yearly exam.  Acquired hypothyroidism She seems euthyroid.  We will check her TFT's today.    Return in about 3 months (around 10/15/2022) for annual.   Crist Fat, MD

## 2022-07-17 LAB — T4, FREE: Free T4: 1.73 ng/dL (ref 0.82–1.77)

## 2022-07-17 LAB — TSH: TSH: 3.54 u[IU]/mL (ref 0.450–4.500)

## 2022-07-27 ENCOUNTER — Telehealth: Payer: Self-pay

## 2022-07-27 NOTE — Telephone Encounter (Signed)
-----   Message from Crist Fat, MD sent at 07/25/2022  9:24 PM EDT ----- Her thyroid fucntion tests are normal.

## 2022-07-27 NOTE — Telephone Encounter (Signed)
Pt notified of lab results

## 2022-08-10 DIAGNOSIS — E663 Overweight: Secondary | ICD-10-CM | POA: Diagnosis not present

## 2022-08-10 DIAGNOSIS — N3001 Acute cystitis with hematuria: Secondary | ICD-10-CM | POA: Diagnosis not present

## 2022-08-10 DIAGNOSIS — I10 Essential (primary) hypertension: Secondary | ICD-10-CM | POA: Diagnosis not present

## 2022-08-10 DIAGNOSIS — Z6828 Body mass index (BMI) 28.0-28.9, adult: Secondary | ICD-10-CM | POA: Diagnosis not present

## 2022-09-12 DIAGNOSIS — H6501 Acute serous otitis media, right ear: Secondary | ICD-10-CM | POA: Diagnosis not present

## 2022-10-18 DIAGNOSIS — R300 Dysuria: Secondary | ICD-10-CM | POA: Diagnosis not present

## 2022-10-19 ENCOUNTER — Ambulatory Visit: Payer: BC Managed Care – PPO | Admitting: Student

## 2022-11-01 ENCOUNTER — Ambulatory Visit: Payer: BC Managed Care – PPO | Admitting: Internal Medicine

## 2022-11-01 ENCOUNTER — Encounter: Payer: Self-pay | Admitting: Internal Medicine

## 2022-11-01 VITALS — BP 118/74 | HR 76 | Temp 98.7°F | Resp 18 | Ht 63.0 in | Wt 152.4 lb

## 2022-11-01 DIAGNOSIS — Z0000 Encounter for general adult medical examination without abnormal findings: Secondary | ICD-10-CM

## 2022-11-01 DIAGNOSIS — I10 Essential (primary) hypertension: Secondary | ICD-10-CM

## 2022-11-01 DIAGNOSIS — N390 Urinary tract infection, site not specified: Secondary | ICD-10-CM | POA: Insufficient documentation

## 2022-11-01 DIAGNOSIS — F411 Generalized anxiety disorder: Secondary | ICD-10-CM

## 2022-11-01 DIAGNOSIS — E039 Hypothyroidism, unspecified: Secondary | ICD-10-CM | POA: Diagnosis not present

## 2022-11-01 DIAGNOSIS — K219 Gastro-esophageal reflux disease without esophagitis: Secondary | ICD-10-CM | POA: Insufficient documentation

## 2022-11-01 DIAGNOSIS — J302 Other seasonal allergic rhinitis: Secondary | ICD-10-CM

## 2022-11-01 DIAGNOSIS — Z6827 Body mass index (BMI) 27.0-27.9, adult: Secondary | ICD-10-CM | POA: Diagnosis not present

## 2022-11-01 DIAGNOSIS — Z853 Personal history of malignant neoplasm of breast: Secondary | ICD-10-CM

## 2022-11-01 LAB — POCT URINALYSIS DIPSTICK
Bilirubin, UA: NEGATIVE
Blood, UA: NEGATIVE
Glucose, UA: NEGATIVE
Ketones, UA: NEGATIVE
Nitrite, UA: NEGATIVE
Protein, UA: NEGATIVE
Spec Grav, UA: 1.025 (ref 1.010–1.025)
Urobilinogen, UA: 0.2 E.U./dL
pH, UA: 5.5 (ref 5.0–8.0)

## 2022-11-01 MED ORDER — CEFDINIR 300 MG PO CAPS: 300.0000 mg | ORAL_CAPSULE | Freq: Two times a day (BID) | ORAL | 0 refills | Status: AC

## 2022-11-01 NOTE — Assessment & Plan Note (Signed)
Her reflux is controlled on omeprazole.

## 2022-11-01 NOTE — Assessment & Plan Note (Signed)
This is mild and controlled on buspar.

## 2022-11-01 NOTE — Assessment & Plan Note (Addendum)
She has a UTI at this time.  We will send for culture and start her on empiric omnicef.  If she continues to have UTI's we will need to refer her to urology.

## 2022-11-01 NOTE — Assessment & Plan Note (Signed)
I want her to eat healthy and try to exercise.

## 2022-11-01 NOTE — Assessment & Plan Note (Signed)
Her BP is currently controlled.  We will continue with lisinopril.

## 2022-11-01 NOTE — Addendum Note (Signed)
Addended byOda Cogan on: 11/01/2022 09:44 AM   Modules accepted: Orders

## 2022-11-01 NOTE — Progress Notes (Signed)
Office Visit  Subjective   Patient ID: HLI RENT   DOB: 03-17-1961   Age: 62 y.o.   MRN: [id number]   Chief Complaint Chief Complaint  Patient presents with   Annual Exam     History of Present Illness Kelli Cooke is a 62 year old Caucasian/White female who presents for her annual health maintenance exam. She is due for the following health maintenance studies: visual exam, colonoscopy, and screening labs. This patient's past medical history Acid reflux, Anxiety, Hiatal hernia, Hypertension, Hypothyroidism, and Seasonal Allergies.   Her last eye exam was about 4 years ago and she denies any problems with her vision. There is a family history of colorectal cancer in her grandmother. She had a colonoscopy when she was 62 years old due to this and it was normal. She has otherwise been using cologuard samples with her last cologuard being negative in 06/30/2018. She had a repeat cologuard done on 07/07/2021 and this was negative.  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  This showed one polyp in the distal rectum and it was benign.  They recommended repeat colonoscopy in 5 years.  The patient has had a complete hysterectomy with SPO in 2001. The patient does exercise by walking. She has never smoked. The patient does not get yearly flu vaccines. She has not had any of the shingles or pneumonia vaccines. She has not had any of the COVID-19 vaccines. There is no depression or memory loss. There is a family history of premature CAD where her dad had a MI at age 31. The patient is not on an ASA.   The patient also was diagnosed with breast cancer this past year in 08/2021.  There is a history of breast cancer in her sister. Her last digital screening mammogram was done on 07/07/2022 and this showed no evidence of malignancy.  The patient was diagnosed  with breast cancer in 08/2021.  She underwent diagnostic breast imaging that demonstrated a left breast group of calcifications.  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  She had a lumpectomy performed  They discussed bilateral prophylactic mastectomy and she was not interested.   They placed her on tamoxifen but she states she only took this for a month and then stopped it due to fatigue.  The patient states oncology released her but her notes from the breast surgeon states they want to see her every 6 months to a year.   The patient is a 62 year old Caucasian/White female who presents for a follow-up evaluation of hypertension. Since her last visit, she has not had any problems.  The patient has been checking her blood pressure at home.  Her systolic BP ranges 120-130's.   The patient's current medications include: lisinopril 20mg  daily. The patient has been tolerating her medications well. The patient denies any headache, visual changes, dizziness, lightheadness, chest pain, shortness of breath, weakness/numbness, and edema. She reports there have been no other symptoms noted.    The patient is a 62 year old Caucasian/White female who returns for a regularly scheduled thyroid check.  Early last year, her Free T4 was elevated and she was having some muscle cramping but otherwise asymptomatic in regards to her thyroid.  I did cut her back to levothyroxine po daily.  She states at age of 40 she was having syncopal episodes and heart palpitations and she had her thyroid checked and it was low.  Her only complaint is that she has had fatigue for years.  She claims to have no symptoms suggestive of thyroid imbalance specifically denying fe, cold intolerance, heat intolerance, tremors, unexplained weight changes, dry skin, constipation and insomnia.  I did see her in 03/2022 for vertigo that started about a month before seeing  [place].  This had been continuous for the last 7 weeks where she had dizziness but no tinnitus or hearing loss at this time.  There was no focal weakness/numbness at this time.  She has been having sinus congestion/pressure that started a week prior.  She was sent to vestibular rehab and her vertigo has not recurred.   She does have a history of seasonal allergies that has been effecting her most of her life.  The spring and fall are the worse periods.  She states she gets nasal congestion with post nasal drip and sore throat with cough at night time.   She has been on singulair in the past but this interacted with her thyroid meds.  She is currently on nasal saline washes, Flonase and Zyrtec.    The patient also has a history of GERD which is controlled on omeprazole at this time.  The patient is a 62 year old Caucasian/White female who presents with generalized anxiety disorder since her dad passed in 1989.  Since her last visit, she states her anxiety is mild and has not worsened.  She has been tried on Zoloft before but she is now on buspar 10mg  BID. This dose does control her anxiety.  She denies any panic attacks. She also reports out of control feelings. She denies difficulty concentrating, difficulty performing routine daily activities,  extreme feelings of guilt, feelings of isolation, feelings of worthlessness, helpless feeling, suicidal ideation, homicidal ideation, weight gain, weight loss, insomnia, loss of appetite, social withdrawal, loss of interest in pleasurable activities, and panic attacks. This patient feels that she is able to care for herself. She currently lives with her husband. She has no significant prior history of mental health disorders.    The patient states she has had multiple UTI's this past year where she has seen quick care and telehealth.  She last talked to telehealth 3 weeks ago where she was not able to give a urine sample but they did give her an antibiotic with  nitrofurantoin.  However, a few days ago she began having feelings again of dysuria and urinary frequency.  There is no f/c, n/v, back/flank pain, hematuria or other problems.     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   Depression    on meds   GERD (gastroesophageal reflux disease)    on meds   Heart murmur    Hiatal hernia    Hypertension    on meds   Hypothyroidism    Seasonal allergies      Allergies Allergies  Allergen Reactions   Latex Itching   Montelukast     Other reaction(s): Dizziness (intolerance)   Sulfa Antibiotics Rash     Medications  Current Outpatient Medications:    busPIRone (BUSPAR) 10 MG tablet, Take 1 tablet (10 mg total) by mouth 2 (two) times daily., Disp: 180 tablet, Rfl: 3   EQ NASAL ALLERGY  55 MCG/ACT AERO nasal inhaler, Place 1 spray into the nose daily as needed., Disp: , Rfl:    levothyroxine (SYNTHROID) 50 MCG tablet, Take 1 tablet (50 mcg total) by mouth every morning., Disp: 90 tablet, Rfl: 3   lisinopril (ZESTRIL) 20 MG tablet, Take 1 tablet (20 mg total) by mouth daily., Disp: 90 tablet, Rfl: 3   omeprazole (PRILOSEC) 20 MG capsule, Take 1 capsule (20 mg total) by mouth daily., Disp: 90 capsule, Rfl: 3   Zinc Oxide-Vitamin C (ZINC PLUS VITAMIN C PO), Take 1,000 mg by mouth daily at 6 (six) AM., Disp: , Rfl:    Review of Systems Review of Systems  Constitutional:  Positive for malaise/fatigue. Negative for chills and fever.  HENT:  Negative for hearing loss and tinnitus.   Eyes:  Negative for blurred vision and double vision.  Respiratory:  Negative for cough, hemoptysis, shortness of breath and wheezing.   Cardiovascular:  Negative for chest pain, palpitations and leg swelling.  Gastrointestinal:  Negative for abdominal pain, blood in stool, constipation, diarrhea, heartburn, melena, nausea and vomiting.  Genitourinary:  Positive for dysuria and  frequency. Negative for hematuria.  Musculoskeletal:  Negative for myalgias.  Skin:  Negative for itching and rash.  Neurological:  Negative for dizziness, weakness and headaches.  Endo/Heme/Allergies:  Negative for polydipsia.  Psychiatric/Behavioral:  Negative for depression.        Objective:    Vitals BP 118/74   Pulse 76   Temp 98.7 F (37.1 C)   Resp 18   Ht 5\' 3"  (1.6 m)   Wt 152 lb 6.4 oz (69.1 kg)   SpO2 97%   BMI 27.00 kg/m    Physical Examination Physical Exam Constitutional:      Appearance: Normal appearance. She is not ill-appearing.  HENT:     Head: Normocephalic and atraumatic.     Right Ear: Tympanic membrane, ear canal and external ear normal.     Left Ear: Tympanic membrane, ear canal and external ear normal.     Nose: Nose normal. No congestion or rhinorrhea.     Mouth/Throat:     Mouth: Mucous membranes are moist.     Pharynx: No oropharyngeal exudate or posterior oropharyngeal erythema.  Eyes:     General: No scleral icterus.       Left eye: No discharge.     Conjunctiva/sclera: Conjunctivae normal.     Pupils: Pupils are equal, round, and reactive to light.  Neck:     Vascular: No carotid bruit.  Cardiovascular:     Rate and Rhythm: Normal rate and regular rhythm.     Pulses: Normal pulses.     Heart sounds: No murmur heard.    No friction rub. No gallop.  Pulmonary:     Effort: Pulmonary effort is normal. No respiratory distress.     Breath sounds: No wheezing, rhonchi or rales.  Abdominal:     General: Bowel sounds are normal. There is no distension.     Palpations: Abdomen is soft.     Tenderness: There is no abdominal tenderness.  Musculoskeletal:     Cervical back: Neck supple. No tenderness.     Right lower leg: No edema.     Left lower leg: No edema.  Lymphadenopathy:     Cervical: No cervical adenopathy.  Skin:    General: Skin is warm and dry.     Findings: No rash.  Neurological:     General: No focal deficit present.  Mental Status: She is alert and oriented to person, place, and time.  Psychiatric:        Mood and Affect: Mood normal.        Behavior: Behavior normal.        Assessment & Plan:   Essential hypertension Her BP is currently controlled.  We will continue with lisinopril.  Gastroesophageal reflux disease Her reflux is controlled on omeprazole.  Acquired hypothyroidism She seems euthyroid.  We will check her TFT's today.  Urinary tract infection without hematuria She has a UTI at this time.  We will send for culture and start her on empiric omnicef.  If she continues to have UTI's we will need to refer her to urology.  Seasonal allergies This is controlled at this time.  History of breast cancer She needs a repeat mammogram.  She was diagnosed with breast cancer but does not have an appointment for followup with her breast surgeon who we will refer back to at this time.  GAD (generalized anxiety disorder) This is mild and controlled on buspar.  BMI 27.0-27.9,adult I want her to eat healthy and try to exercise.  Annual physical exam Health maintenance was discussed.  She needs a repeat mammogram and a repeat colonoscopy in 2028.  We will obtain some yearly labs.    Return in about 3 months (around 02/01/2023).   Crist Fat, MD

## 2022-11-01 NOTE — Assessment & Plan Note (Signed)
She seems euthyroid.  We will check her TFT's today.

## 2022-11-01 NOTE — Assessment & Plan Note (Signed)
She needs a repeat mammogram.  She was diagnosed with breast cancer but does not have an appointment for followup with her breast surgeon who we will refer back to at this time.

## 2022-11-01 NOTE — Assessment & Plan Note (Signed)
This is controlled at this time.

## 2022-11-01 NOTE — Assessment & Plan Note (Signed)
Health maintenance was discussed.  She needs a repeat mammogram and a repeat colonoscopy in 2028.  We will obtain some yearly labs.

## 2022-11-03 NOTE — Progress Notes (Signed)
Her labs look good. Her urine culture really didn't grow out anything. If she has anymore UTI's this year, she will need to see urology.  Patient notified.

## 2022-12-07 ENCOUNTER — Ambulatory Visit: Payer: BC Managed Care – PPO | Admitting: Student

## 2022-12-07 ENCOUNTER — Encounter: Payer: Self-pay | Admitting: Student

## 2022-12-07 VITALS — BP 128/82 | HR 79 | Temp 100.0°F | Resp 18 | Ht 63.0 in | Wt 153.1 lb

## 2022-12-07 DIAGNOSIS — G4483 Primary cough headache: Secondary | ICD-10-CM | POA: Diagnosis not present

## 2022-12-07 LAB — POC COVID19 BINAXNOW: SARS Coronavirus 2 Ag: NEGATIVE

## 2022-12-07 MED ORDER — PROMETHAZINE-DM 6.25-15 MG/5ML PO SYRP
5.0000 mL | ORAL_SOLUTION | Freq: Four times a day (QID) | ORAL | 0 refills | Status: DC | PRN
Start: 2022-12-07 — End: 2023-04-05

## 2022-12-07 MED ORDER — ALBUTEROL SULFATE HFA 108 (90 BASE) MCG/ACT IN AERS
2.0000 | INHALATION_SPRAY | Freq: Four times a day (QID) | RESPIRATORY_TRACT | 0 refills | Status: AC | PRN
Start: 2022-12-07 — End: ?

## 2022-12-07 NOTE — Progress Notes (Signed)
Acute Office Visit  Subjective:     Patient ID: Kelli Cooke, female    DOB: 05/01/1960, 62 y.o.   MRN: [id number]  Chief Complaint  Patient presents with   Cough    Chills, headache, pressure in her head    Cough Associated symptoms include chills, a fever and headaches. Pertinent negatives include no ear pain or sore throat. Her past medical history is significant for environmental allergies.    Patient is in today for complaints of non productive cough for 1 week that has gotten worst, chills, and sinus pressure. She is using OTC cold and flu, Flonase and zyrtec. She has a hx of breast cancer, seasonal allergies and hypertension. She denies chest pain, shob, fever, n/v or diarrhea. Covid swab was negative in office.     Review of Systems  Constitutional:  Positive for chills and fever.  HENT:  Positive for congestion and sinus pain. Negative for ear pain and sore throat.   Eyes: Negative.   Respiratory:  Positive for cough.   Cardiovascular: Negative.   Gastrointestinal: Negative.   Musculoskeletal: Negative.   Skin: Negative.   Neurological:  Positive for headaches.  Endo/Heme/Allergies:  Positive for environmental allergies.         Objective:    BP 128/82 (BP Location: Right Arm, Patient Position: Sitting, Cuff Size: Normal)   Pulse 79   Temp 100 F (37.8 C)   Resp 18   Ht 5\' 3"  (1.6 m)   Wt 153 lb 2 oz (69.5 kg)   SpO2 97%   BMI 27.12 kg/m    Physical Exam Vitals reviewed.  Constitutional:      Appearance: Normal appearance.  HENT:     Head: Normocephalic.     Right Ear: Tympanic membrane, ear canal and external ear normal.     Left Ear: Tympanic membrane, ear canal and external ear normal.     Nose: Nose normal.     Mouth/Throat:     Mouth: Mucous membranes are moist.     Pharynx: Posterior oropharyngeal erythema present.  Eyes:     Extraocular Movements: Extraocular movements intact.     Conjunctiva/sclera: Conjunctivae normal.      Pupils: Pupils are equal, round, and reactive to light.  Cardiovascular:     Rate and Rhythm: Normal rate and regular rhythm.     Pulses: Normal pulses.     Heart sounds: Normal heart sounds.  Pulmonary:     Effort: Pulmonary effort is normal.     Breath sounds: Normal breath sounds. No wheezing.  Abdominal:     General: Abdomen is flat.     Palpations: Abdomen is soft.  Musculoskeletal:     Cervical back: Normal range of motion and neck supple. No tenderness.  Lymphadenopathy:     Cervical: No cervical adenopathy.  Skin:    General: Skin is warm and dry.     Capillary Refill: Capillary refill takes less than 2 seconds.  Neurological:     Mental Status: She is oriented to person, place, and time.  Psychiatric:        Behavior: Behavior normal.     Results for orders placed or performed in visit on 12/07/22  POC COVID-19 BinaxNow  Result Value Ref Range   SARS Coronavirus 2 Ag Negative Negative        Assessment & Plan:   Problem List Items Addressed This Visit     Cough headache - Primary    I will send in  cough syrup to use as needed and albuterol inhaler. She should continue to use supportive care.       Relevant Medications   promethazine-dextromethorphan (PROMETHAZINE-DM) 6.25-15 MG/5ML syrup   albuterol (VENTOLIN HFA) 108 (90 Base) MCG/ACT inhaler   Other Relevant Orders   POC COVID-19 BinaxNow (Completed)    Meds ordered this encounter  Medications   promethazine-dextromethorphan (PROMETHAZINE-DM) 6.25-15 MG/5ML syrup    Sig: Take 5 mLs by mouth 4 (four) times daily as needed for cough.    Dispense:  118 mL    Refill:  0   albuterol (VENTOLIN HFA) 108 (90 Base) MCG/ACT inhaler    Sig: Inhale 2 puffs into the lungs every 6 (six) hours as needed for wheezing or shortness of breath.    Dispense:  8 g    Refill:  0    No follow-ups on file.  Edwena Blow, NP

## 2022-12-07 NOTE — Assessment & Plan Note (Signed)
I will send in cough syrup to use as needed and albuterol inhaler. She should continue to use supportive care.

## 2022-12-10 ENCOUNTER — Other Ambulatory Visit: Payer: Self-pay

## 2022-12-10 ENCOUNTER — Telehealth: Payer: Self-pay

## 2022-12-10 MED ORDER — AZITHROMYCIN 250 MG PO TABS: ORAL_TABLET | ORAL | 0 refills | Status: AC

## 2022-12-10 NOTE — Telephone Encounter (Signed)
Zpack sent into patient's pharmacy: [facility] in [place]  and pt. Informed to use Mucinex and drink plenty of fluids.

## 2022-12-13 ENCOUNTER — Other Ambulatory Visit: Payer: Self-pay | Admitting: Student

## 2022-12-13 MED ORDER — METHYLPREDNISOLONE 4 MG PO TBPK: ORAL_TABLET | ORAL | 0 refills | Status: DC

## 2022-12-17 ENCOUNTER — Other Ambulatory Visit: Payer: Self-pay | Admitting: Student

## 2022-12-17 MED ORDER — AZELASTINE HCL 0.1 % NA SOLN: 2.0000 | Freq: Two times a day (BID) | NASAL | 0 refills | Status: DC

## 2022-12-17 MED ORDER — AZITHROMYCIN 250 MG PO TABS
ORAL_TABLET | ORAL | 0 refills | Status: AC
Start: 2022-12-17 — End: 2022-12-22

## 2022-12-26 ENCOUNTER — Other Ambulatory Visit: Payer: Self-pay | Admitting: Student

## 2022-12-26 DIAGNOSIS — G4483 Primary cough headache: Secondary | ICD-10-CM

## 2022-12-27 ENCOUNTER — Encounter: Payer: Self-pay | Admitting: Student

## 2022-12-27 ENCOUNTER — Ambulatory Visit: Payer: BC Managed Care – PPO | Admitting: Student

## 2022-12-27 VITALS — BP 122/80 | HR 74 | Temp 97.9°F | Resp 18 | Ht 63.0 in | Wt 152.0 lb

## 2022-12-27 DIAGNOSIS — R053 Chronic cough: Secondary | ICD-10-CM | POA: Diagnosis not present

## 2022-12-27 DIAGNOSIS — R918 Other nonspecific abnormal finding of lung field: Secondary | ICD-10-CM | POA: Diagnosis not present

## 2022-12-27 DIAGNOSIS — R051 Acute cough: Secondary | ICD-10-CM | POA: Insufficient documentation

## 2022-12-27 MED ORDER — DM-GUAIFENESIN ER 30-600 MG PO TB12: 1.0000 | ORAL_TABLET | Freq: Two times a day (BID) | ORAL | 0 refills | Status: DC

## 2022-12-27 NOTE — Assessment & Plan Note (Signed)
I am sending her for a two-view chest x-ray.  For bronchospasms I have added Airsupra 2 puffs in the morning 2 puffs at night.  She can use Mucinex DM twice a day and is encouraged to drink plenty of fluids.

## 2022-12-27 NOTE — Progress Notes (Deleted)
Acute Office Visit  Subjective:     Patient ID: Kelli Cooke, female    DOB: 1960-10-16, 62 y.o.   MRN: [id number]  Chief Complaint  Patient presents with  . Cough    Ongoing cough since August, heaviness In chest. Finished meds prescribed and is still taking regular Mucinex.  Mixture of wet and dry cough.    HPI Patient is in today for ***  ROS      Objective:    BP 122/80   Pulse 74   Temp 97.9 F (36.6 C) (Temporal)   Resp 18   Ht 5\' 3"  (1.6 m)   Wt 152 lb (68.9 kg)   SpO2 98%   BMI 26.93 kg/m  {Vitals History (Optional):23777}  Physical Exam  No results found for any visits on 12/27/22.      Assessment & Plan:   Problem List Items Addressed This Visit   None   No orders of the defined types were placed in this encounter.   No follow-ups on file.  Edwena Blow, NP

## 2022-12-27 NOTE — Progress Notes (Signed)
Acute Office Visit  Subjective:     Patient ID: Kelli Cooke, female    DOB: 09-Oct-1960, 62 y.o.   MRN: [id number]  Chief Complaint  Patient presents with   Cough    Ongoing cough since August, heaviness In chest. Finished meds prescribed and is still taking regular Mucinex.  Mixture of wet and dry cough.    Cough    Patient is in today for persistent cough and chest heaviness since August. She was recently treated for a URI earlier this month with albuterol inhaler, promethazine -DM, Medrol dose pack. When symptoms continued so Azithromycin (Z pak) was added in addition to Astelin 0.1 % nasal spray. She reports a productive cough with cloudy mucous, night time coughing spells that keep her awake at night, and feeling like she can't catch her breath. Covid last time was negative. She denies fever, wheezing, hemoptysis, shortness of breath, chills, nausea or vomiting.  There is a significant history for allergies where she has a history of using injections for symptoms.  She does take lisinopril 20 mg daily and reports that she has noticed a cough with the medication but nothing severe.     Review of Systems  Respiratory:  Positive for cough.         Objective:    BP 122/80   Pulse 74   Temp 97.9 F (36.6 C) (Temporal)   Resp 18   Ht 5\' 3"  (1.6 m)   Wt 152 lb (68.9 kg)   SpO2 98%   BMI 26.93 kg/m    Physical Exam Vitals reviewed.  Constitutional:      Appearance: Normal appearance.  Cardiovascular:     Rate and Rhythm: Normal rate and regular rhythm.     Pulses: Normal pulses.     Heart sounds: Normal heart sounds.  Pulmonary:     Effort: Pulmonary effort is normal.     Breath sounds: Decreased air movement present. Examination of the right-upper field reveals decreased breath sounds. Examination of the left-upper field reveals decreased breath sounds. Examination of the right-lower field reveals decreased breath sounds. Examination of the left-lower field  reveals decreased breath sounds. Decreased breath sounds present. No wheezing, rhonchi or rales.  Abdominal:     Palpations: Abdomen is soft.  Skin:    General: Skin is dry.     Capillary Refill: Capillary refill takes less than 2 seconds.  Neurological:     Mental Status: She is alert and oriented to person, place, and time.  Psychiatric:        Mood and Affect: Mood normal.        Behavior: Behavior normal.     No results found for any visits on 12/27/22.      Assessment & Plan:   Problem List Items Addressed This Visit     Acute cough - Primary    I am sending her for a two-view chest x-ray.  For bronchospasms I have added Airsupra 2 puffs in the morning 2 puffs at night.  She can use Mucinex DM twice a day and is encouraged to drink plenty of fluids.        Relevant Medications   dextromethorphan-guaiFENesin (MUCINEX DM) 30-600 MG 12hr tablet    Meds ordered this encounter  Medications   dextromethorphan-guaiFENesin (MUCINEX DM) 30-600 MG 12hr tablet    Sig: Take 1 tablet by mouth 2 (two) times daily.    Dispense:  30 tablet    Refill:  0  No follow-ups on file.  Edwena Blow, NP

## 2022-12-31 ENCOUNTER — Other Ambulatory Visit: Payer: Self-pay | Admitting: Student

## 2022-12-31 MED ORDER — LOSARTAN POTASSIUM 25 MG PO TABS: 25.0000 mg | ORAL_TABLET | Freq: Every day | ORAL | 2 refills | Status: DC

## 2023-01-04 ENCOUNTER — Encounter: Payer: Self-pay | Admitting: Internal Medicine

## 2023-01-11 ENCOUNTER — Other Ambulatory Visit: Payer: Self-pay | Admitting: Student

## 2023-01-11 ENCOUNTER — Other Ambulatory Visit: Payer: Self-pay

## 2023-01-11 ENCOUNTER — Telehealth: Payer: Self-pay

## 2023-01-11 MED ORDER — LISINOPRIL 20 MG PO TABS: 20.0000 mg | ORAL_TABLET | Freq: Every day | ORAL | 2 refills | Status: DC

## 2023-01-11 NOTE — Progress Notes (Signed)
The patient notified me of complaints of dizziness and abdominal bloating since beginning losartan 25 mg. She reports s/s to be intolerable. Taking the medication at night and increasing oral hydration did not help. She does report that her cough has significantly improved. She denies chest pain, shob, leg swelling, palpitations or syncope. As requested I will change losartan back to lisinopril 20 mg daily. She should follow up.

## 2023-01-12 NOTE — Telephone Encounter (Signed)
Patient spoke with provider and agreed to go back on Lisinopril and d/c Losartan

## 2023-02-07 ENCOUNTER — Ambulatory Visit: Payer: BC Managed Care – PPO | Admitting: Internal Medicine

## 2023-04-05 ENCOUNTER — Ambulatory Visit: Payer: BC Managed Care – PPO | Admitting: Internal Medicine

## 2023-04-05 ENCOUNTER — Encounter: Payer: Self-pay | Admitting: Internal Medicine

## 2023-04-05 VITALS — BP 120/78 | HR 70 | Temp 98.6°F | Resp 18 | Ht 63.0 in | Wt 154.6 lb

## 2023-04-05 DIAGNOSIS — E039 Hypothyroidism, unspecified: Secondary | ICD-10-CM | POA: Diagnosis not present

## 2023-04-05 DIAGNOSIS — I10 Essential (primary) hypertension: Secondary | ICD-10-CM | POA: Diagnosis not present

## 2023-04-05 MED ORDER — LISINOPRIL 20 MG PO TABS: 20.0000 mg | ORAL_TABLET | Freq: Every day | ORAL | 3 refills | Status: AC

## 2023-04-05 MED ORDER — LEVOTHYROXINE SODIUM 50 MCG PO TABS: 50.0000 ug | ORAL_TABLET | Freq: Every morning | ORAL | 3 refills | Status: DC

## 2023-04-05 NOTE — Progress Notes (Signed)
 Office Visit  Subjective   Patient ID: Kelli Cooke   DOB: 1960/10/28   Age: 63 y.o.   MRN: [id number]   Chief Complaint Chief Complaint  Patient presents with   Follow-up     History of Present Illness The patient is a 63 year old Caucasian/White female who presents for a follow-up evaluation of hypertension. There has been no problems since her last visit.  The patient has not been checking her blood pressure at home.  The patient's current medications include: lisinopril  20mg  daily. The patient has been tolerating her medications well. The patient denies any headache, visual changes, dizziness, lightheadness, chest pain, shortness of breath, weakness/numbness, and edema. She reports there have been no other symptoms noted.    The patient is a 63 year old Caucasian/White female who returns for a regularly scheduled thyroid  check.  Early last year, her Free T4 was elevated and she was having some muscle cramping but otherwise asymptomatic in regards to her thyroid .  I did cut her back to levothyroxine  50mcg po daily.    She states at age of 11 she was having syncopal episodes and heart palpitations and she had her thyroid  checked and it was low.  Her only complaint is that she has had fatigue for years.  She claims to have no symptoms suggestive of thyroid  imbalance specifically denying fe, cold intolerance, heat intolerance, tremors, unexplained weight changes, dry skin, constipation and insomnia.       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   Depression    on meds   GERD (gastroesophageal reflux disease)    on meds   Heart murmur    Hiatal hernia    Hypertension    on meds   Hypothyroidism    Seasonal allergies      Allergies Allergies  Allergen Reactions   Latex Itching   Montelukast     Other reaction(s): Dizziness (intolerance)   Sulfa Antibiotics Rash      Medications  Current Outpatient Medications:    lisinopril  (ZESTRIL ) 20 MG tablet, Take 1 tablet (20 mg total) by mouth daily., Disp: 30 tablet, Rfl: 2   albuterol  (VENTOLIN  HFA) 108 (90 Base) MCG/ACT inhaler, INHALE 2 PUFFS BY MOUTH EVERY 6 HOURS AS NEEDED FOR WHEEZING OR SHORTNESS OF BREATH, Disp: 9 g, Rfl: 0   azelastine  (ASTELIN ) 0.1 % nasal spray, Place 2 sprays into both nostrils 2 (two) times daily. Use in each nostril as directed, Disp: 30 mL, Rfl: 0   busPIRone  (BUSPAR ) 10 MG tablet, Take 1 tablet (10 mg total) by mouth 2 (two) times daily., Disp: 180 tablet, Rfl: 3   EQ NASAL ALLERGY 55 MCG/ACT AERO nasal inhaler, Place 1 spray into the nose daily as needed., Disp: , Rfl:    levothyroxine  (SYNTHROID ) 50 MCG tablet, Take 1 tablet (50 mcg total) by mouth every morning., Disp: 90 tablet, Rfl: 3   omeprazole  (PRILOSEC) 20 MG capsule, Take 1 capsule (20 mg total) by mouth daily., Disp: 90 capsule, Rfl: 3   Zinc Oxide-Vitamin C (ZINC PLUS VITAMIN C PO), Take 1,000 mg by mouth daily at 6 (six) AM., Disp: , Rfl:    Review of Systems Review of Systems  Constitutional:  Negative for chills, fever, malaise/fatigue and weight loss.  Eyes:  Negative for blurred vision and double vision.  Respiratory:  Negative for shortness of breath.  Cardiovascular:  Negative for chest pain, palpitations and leg swelling.  Skin:  Negative for rash.  Neurological:  Negative for dizziness, weakness and headaches.       Objective:    Vitals BP 120/78   Pulse 70   Temp 98.6 F (37 C)   Resp 18   Ht 5' 3 (1.6 m)   Wt 154 lb 9.6 oz (70.1 kg)   SpO2 97%   BMI 27.39 kg/m    Physical Examination Physical Exam Constitutional:      Appearance: Normal appearance. She is not ill-appearing.  Cardiovascular:     Rate and Rhythm: Normal rate and regular rhythm.     Pulses: Normal pulses.     Heart sounds: No murmur heard.    No friction rub. No gallop.  Pulmonary:     Effort: Pulmonary effort is  normal. No respiratory distress.     Breath sounds: No wheezing, rhonchi or rales.  Abdominal:     General: Bowel sounds are normal. There is no distension.     Palpations: Abdomen is soft.     Tenderness: There is no abdominal tenderness.  Musculoskeletal:     Right lower leg: No edema.     Left lower leg: No edema.  Skin:    General: Skin is warm and dry.     Findings: No rash.  Neurological:     Mental Status: She is alert.        Assessment & Plan:   Essential hypertension Her BP is currently controlled.  We will continue on her lisinopril .  Hypothyroidism We will check her TFT's on her next visit.  She seems to be euthyroid.    Return in about 3 months (around 07/04/2023).   Selinda Fleeta Finger, MD

## 2023-04-05 NOTE — Assessment & Plan Note (Signed)
 We will check her TFT's on her next visit.  She seems to be euthyroid.

## 2023-04-05 NOTE — Assessment & Plan Note (Signed)
 Her BP is currently controlled.  We will continue on her lisinopril.

## 2023-04-23 DIAGNOSIS — J0100 Acute maxillary sinusitis, unspecified: Secondary | ICD-10-CM | POA: Diagnosis not present

## 2023-05-15 DIAGNOSIS — N390 Urinary tract infection, site not specified: Secondary | ICD-10-CM | POA: Diagnosis not present

## 2023-07-11 ENCOUNTER — Other Ambulatory Visit: Payer: Self-pay | Admitting: Internal Medicine

## 2023-07-11 DIAGNOSIS — R928 Other abnormal and inconclusive findings on diagnostic imaging of breast: Secondary | ICD-10-CM | POA: Diagnosis not present

## 2023-07-12 ENCOUNTER — Ambulatory Visit: Payer: BC Managed Care – PPO | Admitting: Internal Medicine

## 2023-07-23 ENCOUNTER — Other Ambulatory Visit: Payer: Self-pay | Admitting: Internal Medicine

## 2023-07-26 ENCOUNTER — Ambulatory Visit: Admitting: Internal Medicine

## 2023-07-27 ENCOUNTER — Encounter: Payer: Self-pay | Admitting: Internal Medicine

## 2023-08-03 ENCOUNTER — Encounter: Payer: Self-pay | Admitting: Internal Medicine

## 2023-08-03 ENCOUNTER — Ambulatory Visit: Admitting: Internal Medicine

## 2023-08-03 VITALS — BP 136/76 | HR 68 | Temp 98.0°F | Resp 18 | Ht 63.0 in | Wt 146.4 lb

## 2023-08-03 DIAGNOSIS — I10 Essential (primary) hypertension: Secondary | ICD-10-CM

## 2023-08-03 DIAGNOSIS — E039 Hypothyroidism, unspecified: Secondary | ICD-10-CM | POA: Diagnosis not present

## 2023-08-03 NOTE — Assessment & Plan Note (Signed)
 Her BP is currently controlled.  We will continue to monitor and continue her on lisinopril .

## 2023-08-03 NOTE — Progress Notes (Signed)
 Office Visit  Subjective   Patient ID: Kelli Cooke   DOB: [birth year]   Age: 63 y.o.   MRN: [id number]   Chief Complaint No chief complaint on file.    History of Present Illness The patient is a 63 year old Caucasian/White female who presents for a follow-up evaluation of hypertension. There has been no problems since her last visit.  The patient has not been checking her blood pressure at home.  The patient's current medications include: lisinopril  20mg  daily. The patient has been tolerating her medications well. The patient denies any headache, visual changes, dizziness, lightheadness, chest pain, shortness of breath, weakness/numbness, and edema. She reports there have been no other symptoms noted.    The patient is a 63 year old Caucasian/White female who returns for a regularly scheduled thyroid  check.  Early last year, her Free T4 was elevated and she was having some muscle cramping but otherwise asymptomatic in regards to her thyroid .  I did cut her back to levothyroxine  50mcg po daily.    She states at age of 89 she was having syncopal episodes and heart palpitations and she had her thyroid  checked and it was low.  Her only complaint is that she has had fatigue for years.  She claims to have no symptoms suggestive of thyroid  imbalance specifically denying  cold intolerance, heat intolerance, tremors, unexplained weight changes, dry skin, constipation and insomnia.      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   Depression    on meds   GERD (gastroesophageal reflux disease)    on meds   Heart murmur    Hiatal hernia    Hypertension    on meds   Hypothyroidism    Seasonal allergies      Allergies Allergies  Allergen Reactions   Latex Itching   Montelukast     Other reaction(s): Dizziness (intolerance)   Sulfa Antibiotics Rash     Medications  Current Outpatient Medications:     albuterol  (VENTOLIN  HFA) 108 (90 Base) MCG/ACT inhaler, INHALE 2 PUFFS BY MOUTH EVERY 6 HOURS AS NEEDED FOR WHEEZING OR SHORTNESS OF BREATH, Disp: 9 g, Rfl: 0   azelastine  (ASTELIN ) 0.1 % nasal spray, Place 2 sprays into both nostrils 2 (two) times daily. Use in each nostril as directed, Disp: 30 mL, Rfl: 0   busPIRone  (BUSPAR ) 10 MG tablet, Take 1 tablet by mouth twice daily, Disp: 180 tablet, Rfl: 0   EQ NASAL ALLERGY 55 MCG/ACT AERO nasal inhaler, Place 1 spray into the nose daily as needed., Disp: , Rfl:    levothyroxine  (SYNTHROID ) 50 MCG tablet, Take 1 tablet (50 mcg total) by mouth every morning., Disp: 90 tablet, Rfl: 3   lisinopril  (ZESTRIL ) 20 MG tablet, Take 1 tablet (20 mg total) by mouth daily., Disp: 90 tablet, Rfl: 3   omeprazole  (PRILOSEC) 20 MG capsule, Take 1 capsule (20 mg total) by mouth daily., Disp: 90 capsule, Rfl: 3   Zinc Oxide-Vitamin C (ZINC PLUS VITAMIN C PO), Take 1,000 mg by mouth daily at 6 (six) AM., Disp: , Rfl:    Review of Systems Review of Systems  Constitutional:  Positive for malaise/fatigue. Negative for chills and fever.  Eyes:  Negative for blurred vision and double vision.  Respiratory:  Negative for shortness of breath.   Cardiovascular:  Negative for chest pain, palpitations and leg swelling.  Gastrointestinal:  Negative for abdominal pain, constipation, diarrhea, nausea and vomiting.  Neurological:  Negative for dizziness, weakness and headaches.       Objective:    Vitals BP 136/76   Pulse 68   Temp 98 F (36.7 C) (Temporal)   Resp 18   Ht 5\' 3"  (1.6 m)   Wt 146 lb 6.4 oz (66.4 kg)   SpO2 99%   BMI 25.93 kg/m    Physical Examination Physical Exam Constitutional:      Appearance: Normal appearance. She is not ill-appearing.  Cardiovascular:     Rate and Rhythm: Normal rate and regular rhythm.     Pulses: Normal pulses.     Heart sounds: No murmur heard.    No friction rub. No gallop.  Pulmonary:     Effort: Pulmonary effort  is normal. No respiratory distress.     Breath sounds: No wheezing, rhonchi or rales.  Abdominal:     General: Bowel sounds are normal. There is no distension.     Palpations: Abdomen is soft.     Tenderness: There is no abdominal tenderness.  Musculoskeletal:     Right lower leg: No edema.     Left lower leg: No edema.  Skin:    General: Skin is warm and dry.     Findings: No rash.  Neurological:     Mental Status: She is alert.        Assessment & Plan:   Essential hypertension Her BP is currently controlled.  We will continue to monitor and continue her on lisinopril .  Hypothyroidism She seems euthyroid except for her longstanding fatigue.  We will check her TFT's today.    Return in about 3 months (around 11/03/2023) for annual.   Wayne Haines, MD

## 2023-08-03 NOTE — Assessment & Plan Note (Signed)
 She seems euthyroid except for her longstanding fatigue.  We will check her TFT's today.

## 2023-11-17 ENCOUNTER — Encounter: Admitting: Internal Medicine

## 2023-12-07 ENCOUNTER — Encounter: Payer: Self-pay | Admitting: Internal Medicine

## 2023-12-07 ENCOUNTER — Ambulatory Visit: Admitting: Internal Medicine

## 2023-12-07 VITALS — BP 124/78 | HR 69 | Temp 97.9°F | Resp 18 | Ht 63.0 in | Wt 144.0 lb

## 2023-12-07 DIAGNOSIS — J302 Other seasonal allergic rhinitis: Secondary | ICD-10-CM

## 2023-12-07 DIAGNOSIS — I10 Essential (primary) hypertension: Secondary | ICD-10-CM

## 2023-12-07 DIAGNOSIS — Z6825 Body mass index (BMI) 25.0-25.9, adult: Secondary | ICD-10-CM | POA: Insufficient documentation

## 2023-12-07 DIAGNOSIS — F411 Generalized anxiety disorder: Secondary | ICD-10-CM

## 2023-12-07 DIAGNOSIS — Z0000 Encounter for general adult medical examination without abnormal findings: Secondary | ICD-10-CM | POA: Diagnosis not present

## 2023-12-07 DIAGNOSIS — Z853 Personal history of malignant neoplasm of breast: Secondary | ICD-10-CM

## 2023-12-07 DIAGNOSIS — E039 Hypothyroidism, unspecified: Secondary | ICD-10-CM | POA: Diagnosis not present

## 2023-12-07 DIAGNOSIS — K219 Gastro-esophageal reflux disease without esophagitis: Secondary | ICD-10-CM

## 2023-12-07 NOTE — Assessment & Plan Note (Signed)
 Health maintenance was discussed.  She will need a repeat colonoscopy done in 2028.  We will obtain some yearly labs today.

## 2023-12-07 NOTE — Assessment & Plan Note (Signed)
 We tried to get labs on her on her 6 month visit for thyroid  but this was not done.  She seems euthyroid today.  We will check TFT's.

## 2023-12-07 NOTE — Assessment & Plan Note (Signed)
 Buspar  controls her anxiety at this time.

## 2023-12-07 NOTE — Progress Notes (Signed)
 Office Visit  Subjective   Patient ID: Kelli Cooke   DOB: 1960/09/20   Age: 63 y.o.   MRN: [id number]   Chief Complaint Chief Complaint  Patient presents with   Annual Exam    CPE     History of Present Illness Kelli Cooke is a 63 year old Caucasian/White female who presents for her annual health maintenance exam. She is due for the following health maintenance studies: screening labs. This patient's past medical history Acid reflux, Anxiety, Hiatal hernia, Hypertension, Hypothyroidism, and Seasonal Allergies.    Her last eye exam was in 2024 and she denies any problems with her vision. There is a family history of colorectal cancer in her grandmother. She had a colonoscopy when she was 63 years old due to this and it was normal. She has otherwise been using cologuard samples with her last cologuard being negative in 06/30/2018. She had a repeat cologuard done on 07/07/2021 and this was negative.  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  This showed one polyp in the distal rectum and it was benign.  They recommended repeat colonoscopy in 5 years.   Her last digital screening mammogram was done on 07/11/2023 and this was normal (see below).  The patient has had a complete hysterectomy with SPO in 2001. The patient does exercise by walking. She has never smoked. The patient does not get yearly flu vaccines. She has not had any of the shingles or pneumonia vaccines. She has not had any of the COVID-19 vaccines. There is no depression or memory loss. There is a family history of premature CAD where her dad had a MI at age 47. The patient is not on an ASA.   The patient is a 63 year old Caucasian/White female who presents for a follow-up evaluation of hypertension. There has been no problems since her last visit.  The patient has not been checking her  blood pressure at home.  The patient's current medications include: lisinopril  20mg  daily. The patient has been tolerating her medications well. The patient denies any headache, visual changes, dizziness, lightheadness, chest pain, shortness of breath, weakness/numbness, and edema. She reports there have been no other symptoms noted.    The patient is a 63 year old Caucasian/White female who returns for a regularly scheduled thyroid  check.  Early last year, her Free T4 was elevated and she was having some muscle cramping but otherwise asymptomatic in regards to her thyroid .  I did cut her back to levothyroxine  50mcg po daily.    She states at age of 83 she was having syncopal episodes and heart palpitations and she had her thyroid  checked and it was low.  Her only complaint is that she has had fatigue for years.  She claims to have no symptoms suggestive of thyroid  imbalance specifically denying  cold intolerance, heat intolerance, tremors, unexplained weight changes, dry skin, constipation and insomnia.   The patient also was diagnosed with breast cancer this past year in 08/2021.  There is a history of breast cancer in her sister. Her last digital screening mammogram was done on 07/11/2023 and this showed no evidence of malignancy.  The patient was diagnosed with breast cancer in 08/2021.  She underwent diagnostic breast imaging that demonstrated a left breast group of calcifications.  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  She had a lumpectomy performed  They discussed bilateral prophylactic mastectomy and she was not interested.   They placed her on tamoxifen but she states she only took this for a month and then stopped it due to fatigue.  The patient states oncology released her but her notes from the breast surgeon states they want to see her every 6 months to a year.    I did see her in 03/2022 for vertigo that started  about a month before seeing us .  This had been continuous for the last 7 weeks where she had dizziness but no tinnitus or hearing loss at this time.  There was no focal weakness/numbness at this time.  She has been having sinus congestion/pressure that started a week prior.  She was sent to vestibular rehab and her vertigo has not recurred.   She does have a history of seasonal allergies that has been effecting her most of her life.  The spring and fall are the worse periods.  She states she gets nasal congestion with post nasal drip and sore throat with cough at night time.   She has been on singulair in the past but this interacted with her thyroid  meds.  She is currently on nasal saline washes, Flonase and Zyrtec.     The patient also has a history of GERD which is controlled on omeprazole  at this time.   The patient is a 63 year old Caucasian/White female who presents with generalized anxiety disorder since her dad passed in 1989.  Since her last visit, she states her anxiety is mild and has not worsened.  She has been tried on Zoloft before but she is now on buspar  10mg  BID. This dose does control her anxiety.  She denies any panic attacks. She also reports out of control feelings. She denies difficulty concentrating, difficulty performing routine daily activities,  extreme feelings of guilt, feelings of isolation, feelings of worthlessness, helpless feeling, suicidal ideation, homicidal ideation, weight gain, weight loss, insomnia, loss of appetite, social withdrawal, loss of interest in pleasurable activities, and panic attacks. This patient feels that she is able to care for herself. She currently lives with her husband. She has no significant prior history of mental health disorders.        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   Depression    on meds   GERD (gastroesophageal reflux disease)    on meds    Heart murmur    Hiatal hernia    Hypertension    on meds   Hypothyroidism    Seasonal allergies      Allergies Allergies  Allergen Reactions   Latex Itching   Montelukast     Other reaction(s): Dizziness (intolerance)   Sulfa Antibiotics Rash     Medications  Current Outpatient Medications:    albuterol  (VENTOLIN  HFA) 108 (90 Base) MCG/ACT inhaler, INHALE 2 PUFFS BY MOUTH EVERY 6 HOURS AS NEEDED FOR WHEEZING OR SHORTNESS OF BREATH, Disp: 9 g, Rfl: 0   busPIRone  (BUSPAR ) 10 MG tablet, Take 1 tablet by mouth twice daily, Disp: 180 tablet, Rfl: 0   EQ NASAL ALLERGY 55 MCG/ACT AERO nasal inhaler, Place 1 spray into the nose daily as needed., Disp: , Rfl:    levothyroxine  (SYNTHROID ) 50 MCG tablet, Take 1 tablet (50 mcg total) by mouth every morning.,  Disp: 90 tablet, Rfl: 3   lisinopril  (ZESTRIL ) 20 MG tablet, Take 1 tablet (20 mg total) by mouth daily., Disp: 90 tablet, Rfl: 3   omeprazole  (PRILOSEC) 20 MG capsule, Take 1 capsule (20 mg total) by mouth daily., Disp: 90 capsule, Rfl: 3   Zinc Oxide-Vitamin C (ZINC PLUS VITAMIN C PO), Take 1,000 mg by mouth daily at 6 (six) AM., Disp: , Rfl:    Review of Systems Review of Systems  Constitutional:  Negative for chills, fever, malaise/fatigue and weight loss.  Eyes:  Negative for blurred vision and double vision.  Respiratory:  Negative for cough and shortness of breath.   Cardiovascular:  Negative for chest pain, palpitations and leg swelling.  Gastrointestinal:  Negative for abdominal pain, constipation, diarrhea, heartburn, nausea and vomiting.  Genitourinary:  Negative for frequency and hematuria.  Musculoskeletal:  Negative for myalgias.  Skin:  Negative for itching and rash.  Neurological:  Negative for dizziness, weakness and headaches.  Endo/Heme/Allergies:  Negative for polydipsia.  Psychiatric/Behavioral:  Negative for depression. The patient is nervous/anxious. The patient does not have insomnia.        Objective:     Vitals BP 124/78   Pulse 69   Temp 97.9 F (36.6 C) (Temporal)   Resp 18   Ht 5' 3 (1.6 m)   Wt 144 lb (65.3 kg)   SpO2 98%   BMI 25.51 kg/m    Physical Examination Physical Exam Constitutional:      Appearance: Normal appearance. She is not ill-appearing.  HENT:     Head: Normocephalic and atraumatic.     Right Ear: Tympanic membrane, ear canal and external ear normal.     Left Ear: Tympanic membrane, ear canal and external ear normal.     Nose: Nose normal. No congestion or rhinorrhea.     Mouth/Throat:     Mouth: Mucous membranes are moist.     Pharynx: Oropharynx is clear. No oropharyngeal exudate or posterior oropharyngeal erythema.  Eyes:     General: No scleral icterus.    Conjunctiva/sclera: Conjunctivae normal.     Pupils: Pupils are equal, round, and reactive to light.  Neck:     Vascular: No carotid bruit.  Cardiovascular:     Rate and Rhythm: Normal rate and regular rhythm.     Pulses: Normal pulses.     Heart sounds: No murmur heard.    No friction rub. No gallop.  Pulmonary:     Effort: Pulmonary effort is normal. No respiratory distress.     Breath sounds: No wheezing, rhonchi or rales.  Abdominal:     General: Bowel sounds are normal. There is no distension.     Palpations: Abdomen is soft.     Tenderness: There is no abdominal tenderness.  Musculoskeletal:     Cervical back: Neck supple. No tenderness.     Right lower leg: No edema.     Left lower leg: No edema.  Lymphadenopathy:     Cervical: No cervical adenopathy.  Skin:    General: Skin is warm and dry.     Findings: No rash.  Neurological:     General: No focal deficit present.     Mental Status: She is alert and oriented to person, place, and time.  Psychiatric:        Mood and Affect: Mood normal.        Behavior: Behavior normal.        Assessment & Plan:   Essential hypertension Her BP  is doing well and is controlled on her dose of lisinopril .  Gastroesophageal reflux  disease Her diet and her omeprazole  control her reflux symptoms.  Hypothyroidism We tried to get labs on her on her 6 month visit for thyroid  but this was not done.  She seems euthyroid today.  We will check TFT's.  Seasonal allergies Continue her OTC meds as need with singulair.  History of breast cancer She is no longer followed by oncology.  She will need to continue with yearly screening mammograms.  GAD (generalized anxiety disorder) Buspar  controls her anxiety at this time.  BMI 25.0-25.9,adult She will continue on diet and exercising.  Annual physical exam Health maintenance was discussed.  She will need a repeat colonoscopy done in 2028.  We will obtain some yearly labs today.    Return in about 3 months (around 03/07/2024).   Selinda Fleeta Finger, MD

## 2023-12-07 NOTE — Assessment & Plan Note (Signed)
 Her BP is doing well and is controlled on her dose of lisinopril .

## 2023-12-07 NOTE — Assessment & Plan Note (Signed)
 She will continue on diet and exercising.

## 2023-12-07 NOTE — Assessment & Plan Note (Signed)
 She is no longer followed by oncology.  She will need to continue with yearly screening mammograms.

## 2023-12-07 NOTE — Assessment & Plan Note (Signed)
 Continue her OTC meds as need with singulair.

## 2023-12-07 NOTE — Assessment & Plan Note (Signed)
 Her diet and her omeprazole  control her reflux symptoms.

## 2023-12-08 ENCOUNTER — Encounter: Payer: Self-pay | Admitting: Internal Medicine

## 2023-12-08 LAB — CBC WITH DIFFERENTIAL/PLATELET
Basophils Absolute: 0.1 x10E3/uL (ref 0.0–0.2)
Basos: 1 %
EOS (ABSOLUTE): 0.1 x10E3/uL (ref 0.0–0.4)
Eos: 1 %
Hematocrit: 40.9 % (ref 34.0–46.6)
Hemoglobin: 13.4 g/dL (ref 11.1–15.9)
Immature Grans (Abs): 0 x10E3/uL (ref 0.0–0.1)
Immature Granulocytes: 0 %
Lymphocytes Absolute: 2 x10E3/uL (ref 0.7–3.1)
Lymphs: 31 %
MCH: 30.1 pg (ref 26.6–33.0)
MCHC: 32.8 g/dL (ref 31.5–35.7)
MCV: 92 fL (ref 79–97)
Monocytes Absolute: 0.4 x10E3/uL (ref 0.1–0.9)
Monocytes: 6 %
Neutrophils Absolute: 3.9 x10E3/uL (ref 1.4–7.0)
Neutrophils: 61 %
Platelets: 424 x10E3/uL (ref 150–450)
RBC: 4.45 x10E6/uL (ref 3.77–5.28)
RDW: 11.8 % (ref 11.7–15.4)
WBC: 6.4 x10E3/uL (ref 3.4–10.8)

## 2023-12-08 LAB — CMP14 + ANION GAP
ALT: 18 IU/L (ref 0–32)
AST: 18 IU/L (ref 0–40)
Albumin: 4.6 g/dL (ref 3.9–4.9)
Alkaline Phosphatase: 95 IU/L (ref 44–121)
Anion Gap: 16 mmol/L (ref 10.0–18.0)
BUN/Creatinine Ratio: 22 (ref 12–28)
BUN: 11 mg/dL (ref 8–27)
Bilirubin Total: 0.2 mg/dL (ref 0.0–1.2)
CO2: 25 mmol/L (ref 20–29)
Calcium: 9.9 mg/dL (ref 8.7–10.3)
Chloride: 91 mmol/L — ABNORMAL LOW (ref 96–106)
Creatinine, Ser: 0.49 mg/dL — ABNORMAL LOW (ref 0.57–1.00)
Globulin, Total: 2.4 g/dL (ref 1.5–4.5)
Glucose: 88 mg/dL (ref 70–99)
Potassium: 4.8 mmol/L (ref 3.5–5.2)
Sodium: 132 mmol/L — ABNORMAL LOW (ref 134–144)
Total Protein: 7 g/dL (ref 6.0–8.5)
eGFR: 107 mL/min/1.73 (ref >59)

## 2023-12-08 LAB — LIPID PANEL
Chol/HDL Ratio: 3.7 ratio (ref 0.0–4.4)
Cholesterol, Total: 175 mg/dL (ref 100–199)
HDL: 47 mg/dL (ref >39)
LDL Chol Calc (NIH): 117 mg/dL — ABNORMAL HIGH (ref 0–99)
Triglycerides: 58 mg/dL (ref 0–149)
VLDL Cholesterol Cal: 11 mg/dL (ref 5–40)

## 2023-12-08 LAB — T4, FREE: Free T4: 1.75 ng/dL (ref 0.82–1.77)

## 2023-12-08 LAB — HEMOGLOBIN A1C
Est. average glucose Bld gHb Est-mCnc: 111 mg/dL
Hgb A1c MFr Bld: 5.5 % (ref 4.8–5.6)

## 2023-12-08 LAB — TSH: TSH: 2.63 u[IU]/mL (ref 0.450–4.500)

## 2023-12-20 ENCOUNTER — Other Ambulatory Visit: Payer: Self-pay

## 2023-12-20 ENCOUNTER — Other Ambulatory Visit: Payer: Self-pay | Admitting: Internal Medicine

## 2023-12-20 ENCOUNTER — Ambulatory Visit: Payer: Self-pay

## 2023-12-20 MED ORDER — BUSPIRONE HCL 10 MG PO TABS: 10.0000 mg | ORAL_TABLET | Freq: Two times a day (BID) | ORAL | 0 refills | Status: DC

## 2023-12-20 MED ORDER — LEVOTHYROXINE SODIUM 50 MCG PO TABS: 50.0000 ug | ORAL_TABLET | Freq: Every morning | ORAL | 3 refills | Status: AC

## 2023-12-20 NOTE — Progress Notes (Signed)
 Rx refill

## 2023-12-20 NOTE — Progress Notes (Signed)
 Patient called.  Patient aware.  I have called and informed the patient that Dr. Fleeta Finger stated  Her labs look good .  Pt aware.

## 2024-01-05 ENCOUNTER — Other Ambulatory Visit: Payer: Self-pay | Admitting: Internal Medicine

## 2024-03-06 ENCOUNTER — Ambulatory Visit: Admitting: Internal Medicine
